# Patient Record
Sex: Female | Born: 1970 | Race: White | Hispanic: No | Marital: Married | Smoking: Never smoker
Health system: Southern US, Community
[De-identification: ages and names within clinical notes are randomized; demographics above are authoritative.]

## PROBLEM LIST (undated history)

## (undated) DIAGNOSIS — Z8742 Personal history of other diseases of the female genital tract: Secondary | ICD-10-CM

## (undated) DIAGNOSIS — Z8719 Personal history of other diseases of the digestive system: Secondary | ICD-10-CM

## (undated) DIAGNOSIS — Z8041 Family history of malignant neoplasm of ovary: Secondary | ICD-10-CM

## (undated) DIAGNOSIS — K219 Gastro-esophageal reflux disease without esophagitis: Secondary | ICD-10-CM

## (undated) DIAGNOSIS — R51 Headache: Secondary | ICD-10-CM

## (undated) DIAGNOSIS — F329 Major depressive disorder, single episode, unspecified: Secondary | ICD-10-CM

## (undated) HISTORY — PX: DILATION AND CURETTAGE OF UTERUS: SHX78

## (undated) HISTORY — DX: Gilbert syndrome: E80.4

## (undated) HISTORY — DX: Major depressive disorder, single episode, unspecified: F32.9

## (undated) HISTORY — DX: Headache: R51

## (undated) HISTORY — PX: WISDOM TOOTH EXTRACTION: SHX21

## (undated) HISTORY — DX: Family history of malignant neoplasm of ovary: Z80.41

## (undated) HISTORY — PX: COLONOSCOPY: SHX174

## (undated) HISTORY — DX: Personal history of other diseases of the female genital tract: Z87.42

---

## 1996-11-28 ENCOUNTER — Encounter: Payer: Self-pay | Admitting: Internal Medicine

## 1996-12-09 ENCOUNTER — Encounter: Payer: Self-pay | Admitting: Internal Medicine

## 1999-01-24 ENCOUNTER — Other Ambulatory Visit: Admission: RE | Admit: 1999-01-24 | Discharge: 1999-01-24 | Payer: Self-pay | Admitting: Obstetrics and Gynecology

## 1999-03-30 ENCOUNTER — Ambulatory Visit (HOSPITAL_COMMUNITY): Admission: RE | Admit: 1999-03-30 | Discharge: 1999-03-30 | Payer: Self-pay | Admitting: Obstetrics and Gynecology

## 1999-03-30 ENCOUNTER — Encounter: Payer: Self-pay | Admitting: Obstetrics and Gynecology

## 1999-08-24 ENCOUNTER — Inpatient Hospital Stay (HOSPITAL_COMMUNITY): Admission: AD | Admit: 1999-08-24 | Discharge: 1999-08-26 | Payer: Self-pay | Admitting: Obstetrics and Gynecology

## 2000-02-14 ENCOUNTER — Other Ambulatory Visit: Admission: RE | Admit: 2000-02-14 | Discharge: 2000-02-14 | Payer: Self-pay | Admitting: Obstetrics and Gynecology

## 2001-03-10 ENCOUNTER — Other Ambulatory Visit: Admission: RE | Admit: 2001-03-10 | Discharge: 2001-03-10 | Payer: Self-pay | Admitting: Obstetrics and Gynecology

## 2002-06-30 ENCOUNTER — Other Ambulatory Visit: Admission: RE | Admit: 2002-06-30 | Discharge: 2002-06-30 | Payer: Self-pay | Admitting: Obstetrics and Gynecology

## 2003-07-28 ENCOUNTER — Other Ambulatory Visit: Admission: RE | Admit: 2003-07-28 | Discharge: 2003-07-28 | Payer: Self-pay | Admitting: Obstetrics and Gynecology

## 2004-02-16 ENCOUNTER — Encounter: Admission: RE | Admit: 2004-02-16 | Discharge: 2004-02-16 | Payer: Self-pay | Admitting: Family Medicine

## 2005-05-24 ENCOUNTER — Other Ambulatory Visit: Admission: RE | Admit: 2005-05-24 | Discharge: 2005-05-24 | Payer: Self-pay | Admitting: Obstetrics and Gynecology

## 2005-08-28 ENCOUNTER — Ambulatory Visit: Payer: Self-pay | Admitting: Internal Medicine

## 2005-08-30 ENCOUNTER — Ambulatory Visit: Payer: Self-pay | Admitting: Internal Medicine

## 2005-09-18 ENCOUNTER — Ambulatory Visit: Payer: Self-pay | Admitting: Internal Medicine

## 2006-06-11 ENCOUNTER — Other Ambulatory Visit: Admission: RE | Admit: 2006-06-11 | Discharge: 2006-06-11 | Payer: Self-pay | Admitting: Obstetrics and Gynecology

## 2006-08-21 ENCOUNTER — Ambulatory Visit: Payer: Self-pay | Admitting: Internal Medicine

## 2006-09-05 ENCOUNTER — Ambulatory Visit: Payer: Self-pay | Admitting: Internal Medicine

## 2006-09-24 ENCOUNTER — Ambulatory Visit: Payer: Self-pay | Admitting: Internal Medicine

## 2007-09-26 ENCOUNTER — Ambulatory Visit: Payer: Self-pay | Admitting: Internal Medicine

## 2007-09-26 DIAGNOSIS — N943 Premenstrual tension syndrome: Secondary | ICD-10-CM | POA: Insufficient documentation

## 2007-09-26 DIAGNOSIS — M2550 Pain in unspecified joint: Secondary | ICD-10-CM | POA: Insufficient documentation

## 2007-09-28 LAB — CONVERTED CEMR LAB
Basophils Absolute: 0 10*3/uL (ref 0.0–0.1)
Basophils Relative: 0.3 % (ref 0.0–1.0)
Eosinophils Absolute: 0.1 10*3/uL (ref 0.0–0.6)
Eosinophils Relative: 2 % (ref 0.0–5.0)
HCT: 39.3 % (ref 36.0–46.0)
Hemoglobin: 13.9 g/dL (ref 12.0–15.0)
Lymphocytes Relative: 28.3 % (ref 12.0–46.0)
MCHC: 35.4 g/dL (ref 30.0–36.0)
MCV: 90.4 fL (ref 78.0–100.0)
Monocytes Absolute: 0.3 10*3/uL (ref 0.2–0.7)
Monocytes Relative: 6.6 % (ref 3.0–11.0)
Neutro Abs: 3.4 10*3/uL (ref 1.4–7.7)
Neutrophils Relative %: 62.8 % (ref 43.0–77.0)
Platelets: 219 10*3/uL (ref 150–400)
RBC: 4.35 M/uL (ref 3.87–5.11)
RDW: 11.3 % — ABNORMAL LOW (ref 11.5–14.6)
Rheumatoid fact SerPl-aCnc: <20 intl units/mL — ABNORMAL LOW (ref 0.0–20.0)
Sed Rate: 4 mm/hr (ref 0–25)
TSH: 1.4 microintl units/mL (ref 0.35–5.50)
WBC: 5.5 10*3/uL (ref 4.5–10.5)

## 2007-09-29 ENCOUNTER — Encounter (INDEPENDENT_AMBULATORY_CARE_PROVIDER_SITE_OTHER): Payer: Self-pay | Admitting: *Deleted

## 2008-01-21 ENCOUNTER — Encounter: Payer: Self-pay | Admitting: Internal Medicine

## 2008-01-21 ENCOUNTER — Telehealth: Payer: Self-pay | Admitting: Internal Medicine

## 2008-01-23 ENCOUNTER — Telehealth (INDEPENDENT_AMBULATORY_CARE_PROVIDER_SITE_OTHER): Payer: Self-pay | Admitting: *Deleted

## 2008-02-20 ENCOUNTER — Ambulatory Visit: Payer: Self-pay | Admitting: Internal Medicine

## 2008-03-01 ENCOUNTER — Telehealth (INDEPENDENT_AMBULATORY_CARE_PROVIDER_SITE_OTHER): Payer: Self-pay | Admitting: *Deleted

## 2008-07-02 ENCOUNTER — Telehealth (INDEPENDENT_AMBULATORY_CARE_PROVIDER_SITE_OTHER): Payer: Self-pay | Admitting: *Deleted

## 2009-01-20 ENCOUNTER — Ambulatory Visit: Payer: Self-pay | Admitting: Internal Medicine

## 2009-01-20 DIAGNOSIS — K219 Gastro-esophageal reflux disease without esophagitis: Secondary | ICD-10-CM

## 2009-01-20 DIAGNOSIS — G43909 Migraine, unspecified, not intractable, without status migrainosus: Secondary | ICD-10-CM | POA: Insufficient documentation

## 2009-01-21 LAB — CONVERTED CEMR LAB: Troponin I: 0.01 ng/mL (ref <0.06)

## 2009-01-24 ENCOUNTER — Encounter (INDEPENDENT_AMBULATORY_CARE_PROVIDER_SITE_OTHER): Payer: Self-pay | Admitting: *Deleted

## 2009-02-01 ENCOUNTER — Ambulatory Visit: Payer: Self-pay | Admitting: Internal Medicine

## 2009-02-01 ENCOUNTER — Encounter (INDEPENDENT_AMBULATORY_CARE_PROVIDER_SITE_OTHER): Payer: Self-pay | Admitting: *Deleted

## 2009-02-01 LAB — CONVERTED CEMR LAB
OCCULT 1: NEGATIVE
OCCULT 2: NEGATIVE
OCCULT 3: NEGATIVE

## 2009-03-29 ENCOUNTER — Ambulatory Visit: Payer: Self-pay | Admitting: Internal Medicine

## 2009-10-12 ENCOUNTER — Ambulatory Visit: Payer: Self-pay | Admitting: Internal Medicine

## 2009-10-28 ENCOUNTER — Telehealth: Payer: Self-pay | Admitting: Internal Medicine

## 2009-11-02 ENCOUNTER — Ambulatory Visit: Payer: Self-pay | Admitting: Internal Medicine

## 2009-12-17 HISTORY — PX: NOVASURE ABLATION: SHX5394

## 2009-12-17 HISTORY — PX: HYSTEROSCOPY: SHX211

## 2009-12-17 HISTORY — PX: COLONOSCOPY: SHX174

## 2010-01-31 ENCOUNTER — Telehealth (INDEPENDENT_AMBULATORY_CARE_PROVIDER_SITE_OTHER): Payer: Self-pay | Admitting: *Deleted

## 2010-02-28 ENCOUNTER — Emergency Department (HOSPITAL_BASED_OUTPATIENT_CLINIC_OR_DEPARTMENT_OTHER): Admission: EM | Admit: 2010-02-28 | Discharge: 2010-02-28 | Payer: Self-pay | Admitting: Emergency Medicine

## 2010-04-21 ENCOUNTER — Ambulatory Visit: Payer: Self-pay | Admitting: Internal Medicine

## 2010-04-21 DIAGNOSIS — K589 Irritable bowel syndrome without diarrhea: Secondary | ICD-10-CM

## 2010-04-24 ENCOUNTER — Encounter (INDEPENDENT_AMBULATORY_CARE_PROVIDER_SITE_OTHER): Payer: Self-pay | Admitting: *Deleted

## 2010-05-04 ENCOUNTER — Ambulatory Visit: Payer: Self-pay | Admitting: Internal Medicine

## 2010-05-08 LAB — CONVERTED CEMR LAB
ALT: 13 units/L (ref 0–35)
AST: 18 units/L (ref 0–37)
Albumin: 4.2 g/dL (ref 3.5–5.2)
Alkaline Phosphatase: 43 units/L (ref 39–117)
BUN: 16 mg/dL (ref 6–23)
Basophils Absolute: 0 10*3/uL (ref 0.0–0.1)
Basophils Relative: 0.8 % (ref 0.0–3.0)
Bilirubin, Direct: 0.2 mg/dL (ref 0.0–0.3)
CO2: 29 meq/L (ref 19–32)
Calcium: 9.3 mg/dL (ref 8.4–10.5)
Chloride: 104 meq/L (ref 96–112)
Cholesterol: 174 mg/dL (ref 0–200)
Creatinine, Ser: 0.7 mg/dL (ref 0.4–1.2)
Eosinophils Absolute: 0.2 10*3/uL (ref 0.0–0.7)
Eosinophils Relative: 4.1 % (ref 0.0–5.0)
GFR calc non Af Amer: 95.29 mL/min (ref >60)
Glucose, Bld: 83 mg/dL (ref 70–99)
HCT: 42 % (ref 36.0–46.0)
HDL: 71.6 mg/dL (ref >39.00)
Hemoglobin: 14.5 g/dL (ref 12.0–15.0)
LDL Cholesterol: 95 mg/dL (ref 0–99)
Lymphocytes Relative: 38.8 % (ref 12.0–46.0)
Lymphs Abs: 1.5 10*3/uL (ref 0.7–4.0)
MCHC: 34.6 g/dL (ref 30.0–36.0)
MCV: 92.4 fL (ref 78.0–100.0)
Monocytes Absolute: 0.3 10*3/uL (ref 0.1–1.0)
Monocytes Relative: 6.8 % (ref 3.0–12.0)
Neutro Abs: 1.9 10*3/uL (ref 1.4–7.7)
Neutrophils Relative %: 49.5 % (ref 43.0–77.0)
Platelets: 223 10*3/uL (ref 150.0–400.0)
Potassium: 5.1 meq/L (ref 3.5–5.1)
RBC: 4.55 M/uL (ref 3.87–5.11)
RDW: 12.6 % (ref 11.5–14.6)
Sodium: 141 meq/L (ref 135–145)
TSH: 1.64 microintl units/mL (ref 0.35–5.50)
Total Bilirubin: 1.1 mg/dL (ref 0.3–1.2)
Total CHOL/HDL Ratio: 2
Total Protein: 6.5 g/dL (ref 6.0–8.3)
Triglycerides: 38 mg/dL (ref 0.0–149.0)
VLDL: 7.6 mg/dL (ref 0.0–40.0)
WBC: 3.9 10*3/uL — ABNORMAL LOW (ref 4.5–10.5)

## 2010-05-19 ENCOUNTER — Telehealth (INDEPENDENT_AMBULATORY_CARE_PROVIDER_SITE_OTHER): Payer: Self-pay | Admitting: *Deleted

## 2010-07-12 ENCOUNTER — Ambulatory Visit (HOSPITAL_COMMUNITY): Admission: RE | Admit: 2010-07-12 | Discharge: 2010-07-12 | Payer: Self-pay | Admitting: Obstetrics and Gynecology

## 2010-07-18 ENCOUNTER — Telehealth: Payer: Self-pay | Admitting: Gastroenterology

## 2010-07-19 ENCOUNTER — Ambulatory Visit: Payer: Self-pay | Admitting: Gastroenterology

## 2010-07-19 DIAGNOSIS — R142 Eructation: Secondary | ICD-10-CM

## 2010-07-19 DIAGNOSIS — R198 Other specified symptoms and signs involving the digestive system and abdomen: Secondary | ICD-10-CM

## 2010-07-19 DIAGNOSIS — R141 Gas pain: Secondary | ICD-10-CM

## 2010-07-19 DIAGNOSIS — R143 Flatulence: Secondary | ICD-10-CM

## 2010-07-19 DIAGNOSIS — K921 Melena: Secondary | ICD-10-CM

## 2010-07-19 LAB — CONVERTED CEMR LAB: IgA: 112 mg/dL (ref 68–378)

## 2010-09-08 ENCOUNTER — Ambulatory Visit: Payer: Self-pay | Admitting: Gastroenterology

## 2011-01-14 LAB — CONVERTED CEMR LAB
Basophils Absolute: 0 10*3/uL (ref 0.0–0.1)
Basophils Relative: 0.1 % (ref 0.0–3.0)
CK-MB: 1.1 ng/mL (ref 0.3–4.0)
Eosinophils Absolute: 0.2 10*3/uL (ref 0.0–0.7)
Eosinophils Relative: 4 % (ref 0.0–5.0)
HCT: 41.4 % (ref 36.0–46.0)
Hemoglobin: 14.1 g/dL (ref 12.0–15.0)
Lymphocytes Relative: 24.8 % (ref 12.0–46.0)
MCHC: 34 g/dL (ref 30.0–36.0)
MCV: 93.3 fL (ref 78.0–100.0)
Monocytes Absolute: 0.3 10*3/uL (ref 0.1–1.0)
Monocytes Relative: 5.8 % (ref 3.0–12.0)
Neutro Abs: 3.7 10*3/uL (ref 1.4–7.7)
Neutrophils Relative %: 65.3 % (ref 43.0–77.0)
Platelets: 246 10*3/uL (ref 150–400)
RBC: 4.44 M/uL (ref 3.87–5.11)
RDW: 11.8 % (ref 11.5–14.6)
Total CK: 41 units/L (ref 7–177)
WBC: 5.6 10*3/uL (ref 4.5–10.5)

## 2011-01-18 NOTE — Letter (Signed)
Summary: [employer] [facility] [employer]   Imported By: Sherian Rein 07/20/2010 07:36:57  _____________________________________________________________________  External Attachment:    Type:   Image     Comment:   External Document

## 2011-01-18 NOTE — Letter (Signed)
Summary: [employer] [facility] [employer]   Imported By: Sherian Rein 07/20/2010 07:36:09  _____________________________________________________________________  External Attachment:    Type:   Image     Comment:   External Document

## 2011-01-18 NOTE — Letter (Signed)
Summary: New Patient letter  [facility]  [address] [place], [place] [address]   Phone: [phone]  Fax: [phone]       04/24/2010 MRN: [id number]  [facility] [address] [place], [place]  [address]  Dear Ms. Staup,  Welcome to the Gastroenterology Division at [facility].    You are scheduled to see Dr.  Claudette Head on June 13, 2010 at 2:30pm on the 3rd floor at [facility], [address]  We ask that you try to arrive at our office 15 minutes prior to your appointment time to allow for check-in.  We would like you to complete the enclosed self-administered evaluation form prior to your visit and bring it with you on the day of your appointment.  We will review it with you.  Also, please bring a complete list of all your medications or, if you prefer, bring the medication bottles and we will list them.  Please bring your insurance card so that we may make a copy of it.  If your insurance requires a referral to see a specialist, please bring your referral form from your primary care physician.  Co-payments are due at the time of your visit and may be paid by cash, check or credit card.     Your office visit will consist of a consult with your physician (includes a physical exam), any laboratory testing he/she may order, scheduling of any necessary diagnostic testing (e.g. x-ray, ultrasound, CT-scan), and scheduling of a procedure (e.g. Endoscopy, Colonoscopy) if required.  Please allow enough time on your schedule to allow for any/all of these possibilities.    If you cannot keep your appointment, please call [phone] to cancel or reschedule prior to your appointment date.  This allows [place] the opportunity to schedule an appointment for another patient in need of care.  If you do not cancel or reschedule by 5 p.m. the business day prior to your appointment date, you will be charged a $50.00 late cancellation/no-show fee.    Thank you for  choosing [facility] for your medical needs.  We appreciate the opportunity to care for you.  Please visit [place] at our website  to learn more about our practice.                     Sincerely,                                                             The Gastroenterology Division

## 2011-01-18 NOTE — Progress Notes (Signed)
Summary: Med Change  Phone Note Call from Patient Call back at Home Phone [phone]   Caller: Patient Reason for Call: Refill Medication Summary of Call: Patient would like to change her Imitrex to Maxalt. Insurance will cover this.   [facility].  Thanks Initial call taken by: Harold Barban,  May 19, 2010 10:29 AM  Follow-up for Phone Call        Dr.Hopper please advise on med change Follow-up by: Shonna Chock,  May 19, 2010 10:38 AM  Additional Follow-up for Phone Call Additional follow up Details #1::        OK , 10 mg # 9 Additional Follow-up by: Marga Melnick MD,  May 19, 2010 2:14 PM    Additional Follow-up for Phone Call Additional follow up Details #2::    Left message on patient's home machine informing her RX sent in./[facility]  May 19, 2010 2:26 PM   New/Updated Medications: MAXALT 10 MG TABS (RIZATRIPTAN BENZOATE) 1 by mouth as needed x 1 for migraines, may repeat after 2 hours; Max 30mg /day Prescriptions: MAXALT 10 MG TABS (RIZATRIPTAN BENZOATE) 1 by mouth as needed x 1 for migraines, may repeat after 2 hours; Max 30mg /day  #9 x 0   Entered by:   Shonna Chock   Authorized by:   Marga Melnick MD   Signed by:   Shonna Chock on 05/19/2010   Method used:   [facility]  [address] [id number]       [address]       [place], [place]  [address]       Ph: [phone] or [phone]       Fax: [phone]   RxID:   [id number]

## 2011-01-18 NOTE — Procedures (Signed)
Summary: Colonoscopy  Patient: [name] Note: All result statuses are Final unless otherwise noted.  Tests: (1) Colonoscopy (COL)   COL Colonoscopy           DONE     [facility]     [address]     [place], [place]  [address]           COLONOSCOPY PROCEDURE REPORT           PATIENT:  Erica Gordon, Erica Gordon  MR#:  [id number]     BIRTHDATE:  07-Mar-1971, 39 yrs. old  GENDER:  female     ENDOSCOPIST:  Judie Petit T. Russella Dar, MD, [facility]           PROCEDURE DATE:  09/08/2010     PROCEDURE:  Colonoscopy [address]     ASA CLASS:  Class II     INDICATIONS:  1) hematochezia  2) change in bowel habits 3)     abdominal pain and bloating     MEDICATIONS:   Fentanyl 100 mcg IV, Versed 12 mg IV     DESCRIPTION OF PROCEDURE:   After the risks benefits and     alternatives of the procedure were thoroughly explained, informed     consent was obtained.  Digital rectal exam was performed and     revealed no abnormalities.   The LB [id number] endoscope     was introduced through the anus and advanced to the terminal ileum     which was intubated for a short distance, limited by a tortuous     colon. The quality of the prep was excellent, using MoviPrep.  The     instrument was then slowly withdrawn as the colon was fully     examined.     <<PROCEDUREIMAGES>>     FINDINGS:  A normal appearing cecum, ileocecal valve, and     appendiceal orifice were identified. The ascending, hepatic     flexure, transverse, splenic flexure, descending, sigmoid colon,     and rectum appeared unremarkable. The terminal ileum appeared     normal. Retroflexed views in the rectum revealed internal     hemorrhoids, small.  The time to cecum =  8  minutes. The scope     was then withdrawn (time =  8.5  min) from the patient and the     procedure completed.           COMPLICATIONS:  None           ENDOSCOPIC IMPRESSION:     1) Normal colon     2) Normal terminal ileum     3) Internal hemorrhoids        RECOMMENDATIONS:     1) Return to the care of your primary provider. GI follow up as     needed     2) Continue current colorectal screening for "routine risk"     patients with a repeat colonoscopy in 10 years.           Venita Lick. Russella Dar, MD, [facility]           CC: Pecola Lawless, MD           n.     Rosalie DoctorVenita Lick. Stark at 09/08/2010 03:06 PM           Meryl Crutch, [id number]  Note: An exclamation mark (!) indicates a result that was not dispersed into the flowsheet. Document Creation Date: 09/08/2010 3:06 PM _______________________________________________________________________  Marland Kitchen  1) Order result status: Final Collection or observation date-time: 09/08/2010 14:54 Requested date-time:  Receipt date-time:  Reported date-time:  Referring Physician:   Ordering Physician: Claudette Head [phone]) Specimen Source:  Source: Launa Grill Order Number: [id number] Lab site:   Appended Document: Colonoscopy    Clinical Lists Changes  Observations: Added new observation of COLONNXTDUE: 08/2020 (09/08/2010 15:17)

## 2011-01-18 NOTE — Assessment & Plan Note (Signed)
Summary: ibs, gerd....em   History of Present Illness Visit Type: consult Primary GI MD: Elie Goody MD [facility] Primary Mette [place]: Marga Melnick, MD  Requesting [place] Blizzard: Marga Melnick, MD Chief Complaint: Lower abd pain, GERD, bloating, chest pain, and hemorrhoids History of Present Illness:   This is a 40 year old female, who saw Dr. Lina Sar previously in 1997 with crampy, abdominal pain and bloating. Abdominal ultrasound was unremarkable. She was felt to have irritable bowel syndrome.  Patient states she has had problems with abdominal bloating and generalized abdominal discomfort that comes and goes for approximately 20 years. She states her abdomen frequently becomes distended and uncomfortable in the late afternoon or evening and the symptoms generally dissipate by the next morning.  For the past 2 months, she has noticed slight change in bowel habits with loose or less well-formed stools. She has intermittently try to reduce gluten in her diet, which has minimally helped her symptoms.  She occasionally notes small amounts of bright red blood with bowel movements, particularly after running. Recent blood work by Dr. Alwyn Ren was unremarkable. She has intermittent reflux symptoms and intermittent chest pains that are under good control with ranitidine twice daily however she does have frequent breakthrough symptoms.   GI Review of Systems    Reports abdominal pain, acid reflux, bloating, chest pain, and  heartburn.     Location of  Abdominal pain: lower abdomen.    Denies belching, dysphagia with liquids, dysphagia with solids, loss of appetite, nausea, vomiting, vomiting blood, weight loss, and  weight gain.      Reports hemorrhoids.     Denies anal fissure, black tarry stools, change in bowel habit, constipation, diarrhea, diverticulosis, fecal incontinence, heme positive stool, irritable bowel syndrome, jaundice, light color stool, liver problems, rectal bleeding, and  rectal  pain.   Current Medications (verified): 1)  Sumatriptan Succinate 100 Mg Tabs (Sumatriptan Succinate) .... As Directed 2)  Aleve 220 Mg Tabs (Naproxen Sodium) .... As Needed 3)  Ibuprofen 200 Mg Tabs (Ibuprofen) .... As Needed 4)  Multivitamins   Tabs (Multiple Vitamin) .... By Mouth Once Daily 5)  Calcium/magnesium .... 2 By Mouth At Bedtime 6)  Vitamin D3 2000 Unit Caps (Cholecalciferol) .Marland Kitchen.. 1 By Mouth Once Daily 7)  Omega-3 1000 Mg Caps (Omega-3 Fatty Acids) .Marland Kitchen.. 1 By Mouth Once Daily 8)  Ranitidine Hcl 150 Mg Tabs (Ranitidine Hcl) .Marland Kitchen.. 1 Two Times A Day Pre Meals 9)  Maxalt 10 Mg Tabs (Rizatriptan Benzoate) .Marland Kitchen.. 1 By Mouth As Needed X 1 For Migraines, May Repeat After 2 Hours; Max 30mg /day  Allergies (verified): 1)  ! Biaxin  Past History:  Past Medical History: Reviewed history from 04/21/2010 and no changes required. ARTHRALGIA (ICD-719.40) PMS (ICD-625.4) FATIGUE (ICD-780.79) Gilbert's Syndrome Headache, ovulation/menses related GERD; ? 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 IBS, 4 vessel CBAG @ 71, hypothyroidism Mother: HTN,NHL,breast cancer Siblings: sister: hyperthyroidism ; PGM :  hypothyroidism,melanoma; PGF: pacer; MGM : endometrial cancer No FH of Colon Cancer:  Social History: Reviewed history from 04/21/2010 and no changes required. Never Smoked Alcohol use-yes: socially Occupation: Charity fundraiser, Pediatrics Married Regular exercise-yes: CVE 3-4x/week as running 2-3 mpd  Review of Systems       The patient complains of headaches-new.         The pertinent positives and negatives are noted as above and in the HPI. All other ROS were reviewed  and were negative.   Vital Signs:  Patient profile:   40 year old female Height:      67 inches Weight:      130 pounds BMI:     20.43 BSA:     1.69 Pulse rate:   68 / minute Pulse rhythm:   regular BP  sitting:   112 / 74  (left arm) Cuff size:   regular  Vitals Entered By: Ok Anis CMA (July 19, 2010 9:54 AM)  Physical Exam  General:  Well developed, well nourished, no acute distress. Head:  Normocephalic and atraumatic. Eyes:  PERRLA, no icterus. Ears:  Normal auditory acuity. Mouth:  No deformity or lesions, dentition normal. Neck:  Supple; no masses or thyromegaly. Lungs:  Clear throughout to auscultation. Heart:  Regular rate and rhythm; no murmurs, rubs,  or bruits. Abdomen:  Soft, nontender and nondistended. No masses, hepatosplenomegaly or hernias noted. Normal bowel sounds. Rectal:  Normal exam. hemocult negative soft brown stool.   Msk:  Symmetrical with no gross deformities. Normal posture. Pulses:  Normal pulses noted. Extremities:  No clubbing, cyanosis, edema or deformities noted. Neurologic:  Alert and  oriented x4;  grossly normal neurologically. Cervical Nodes:  No significant cervical adenopathy. Inguinal Nodes:  No significant inguinal adenopathy. Psych:  Alert and cooperative. Normal mood and affect.  Impression & Recommendations:  Problem # 1:  CHANGE IN BOWELS (ICD-787.99) Change in bowel habits, with looser stools, hematochezia and frequent abdominal bloating and distention. Rule out celiac disease, IBD, colorectal neoplasms. Rule out bacterial overgrowth. Trial of Levsin. Consider a trial of Xifaxan. The risks, benefits and alternatives to colonoscopy with possible biopsy and possible polypectomy were discussed with the patient and they consent to proceed. The procedure will be scheduled electively. Orders: Colonoscopy (Colon)  Problem # 2:  BLOOD IN STOOL (ICD-578.1) Small-volume hematochezia. Colonoscopy as above. Orders: Colonoscopy (Colon) TLB-IgA (Immunoglobulin A) (82784-IGA) T-Sprue Panel (Celiac Disease Aby Eval) (83516x3/[address])  Problem # 3:  ABDOMINAL DISTENTION (ICD-787.3) Plans as outlined above.  Problem # 4:  GERD  (ICD-530.81) Change to omeprazole 20 mg q.a.m. and begin all standard antireflux measures for improved symptom control. Discontinue ranitidine.  Patient Instructions: 1)  Get your labs drawn today in the basement. 2)  Discontinue ranitidine and start omeprazole which has been sent to your pharmacy with two other prescriptions.  3)  Colonoscopy brochure given.  4)  Copy sent to : Marga Melnick, MD 5)  The medication list was reviewed and reconciled.  All changed / newly prescribed medications were explained.  A complete medication list was provided to the patient / caregiver.  Prescriptions: MOVIPREP 100 GM  SOLR (PEG-KCL-NACL-NASULF-NA ASC-C) As per prep instructions.  #1 x 0   Entered by:   [place]ie Nottingham CMA (AAMA)   Authorized by:   Meryl Dare MD [facility]   Signed by:   Meryl Dare MD FACG on 07/19/2010   Method used:   [facility]  [address] [phone]       [address] [place], [place]  [address]       Ph: [phone] or [phone]       Fax: [phone]   RxID:   [id number] LEVSIN 0.125 MG TABS (HYOSCYAMINE SULFATE) 1-2 tablets by mouth every 4 hours as needed for abdominal pain and bloating  #100 x 11  Entered by:   [place]ie Nottingham CMA (AAMA)   Authorized by:   Meryl Dare MD [facility]   Signed by:   Meryl Dare MD FACG on 07/19/2010   Method used:   [facility]  [address] [phone][facility]       [address] [place], [place]  [address]       Ph: [phone] or [phone]       Fax: [phone]   RxID:   [id number] OMEPRAZOLE 20 MG CPDR (OMEPRAZOLE) one tablet by mouth once daily  #30 x 11   Entered by:   Christie Nottingham CMA (AAMA)   Authorized by:   Meryl Dare MD [facility]   Signed by:   Meryl Dare MD FACG on 07/19/2010   Method used:   [facility]        [facility]  [address] [phone][facility]       [address]       [place], [place]  [address]       Ph:  [phone] or [phone]       Fax: [phone]   RxID:   [id number]

## 2011-01-18 NOTE — Progress Notes (Signed)
Summary: med change   Phone Note Outgoing Call   Call placed by: Jeremy Johann CMA,  January 31, 2010 8:52 AM Summary of Call: letter from Jocelyn Lamer stating that pt with benefit plan sponsored by [employer] requires the use of a generic drug before the plan will cover a non-preferred brand drug starting February 14, 2010.per dr hopper change relpax to sumatriptan 100mg  max # allowed with 3 refills.................................Marland KitchenFelecia Deloach CMA  January 31, 2010 8:55 AM   left message to call office to inform pt of med change....................Marland KitchenFelecia Deloach CMA  January 31, 2010 8:56 AM   Follow-up for Phone Call        pt aware rx sent to pharmacy, and med change....................Marland KitchenFelecia Deloach CMA  February 02, 2010 8:29 AM     New/Updated Medications: SUMATRIPTAN SUCCINATE 100 MG TABS (SUMATRIPTAN SUCCINATE) as directed Prescriptions: SUMATRIPTAN SUCCINATE 100 MG TABS (SUMATRIPTAN SUCCINATE) as directed  #9 x 3   Entered by:   Jeremy Johann CMA   Authorized by:   Marga Melnick MD   Signed by:   Jeremy Johann CMA on 02/02/2010   Method used:   [facility] ...       [facility]  [address] [phone]       [address]       [place], [place]  [address]       Ph: [phone] or [phone]       Fax: [phone]   RxID:   [id number]

## 2011-01-18 NOTE — Progress Notes (Signed)
Summary: switch   Phone Note Call from Patient Call back at Home Phone [phone]   Caller: Patient Call For: Russella Dar Reason for Call: Talk to Nurse Summary of Call: Patient is scheduled to see Dr Russella Dar tomorrow but she is a patient of Dr Juanda Chance but she states that she saw Dr Juanda Chance a very long time ago '97 and does not remember her and her husband is a patient of Dr Russella Dar and she really likes him. Wants to switch from Dr Juanda Chance to Dr Russella Dar and wants to keep her appt for tomorrow with him. Initial call taken by: Tawni Levy,  July 18, 2010 8:48 AM  Follow-up for Phone Call        Dr Russella Dar is this ok with you? Darcey Nora RN, [facility]  July 18, 2010 8:54 AM  Additional Follow-up for Phone Call Additional follow up Details #1::        That is fine with me. Additional Follow-up by: Meryl Dare MD [facility],  July 18, 2010 9:20 AM

## 2011-01-18 NOTE — Assessment & Plan Note (Signed)
Summary: cpx//lch   Vital Signs:  Patient profile:   40 year old female Height:      67 inches Weight:      132.4 pounds BMI:     20.81 Temp:     98.8 degrees F oral Pulse rate:   72 / minute Resp:     14 per minute BP sitting:   110 / 70  (left arm) Cuff size:   regular  Vitals Entered By: Shonna Chock (Apr 21, 2010 4:00 PM)  CC: CPX -complete camp form, General Medical Evaluation Comments REVIEWED MED LIST, PATIENT AGREED DOSE AND INSTRUCTION CORRECT    CC:  CPX -complete camp form and General Medical Evaluation.  History of Present Illness: Erica Gordon is here for a physical; she is asymptomatic if she takes  [employer]  generic  Zantac 150mg  two times a day.  Preventive Screening-Counseling & Management  Caffeine-Diet-Exercise     Does Patient Exercise: yes  Allergies: 1)  ! Biaxin  Past History:  Past Medical History: ARTHRALGIA (ICD-719.40) PMS (ICD-625.4) FATIGUE (ICD-780.79) Gilbert's Syndrome Headache, ovulation/menses related GERD; ? IBS Dysfunctional menses due to Uterine Polyps, Dr Stefano Gaul  Past Surgical History: G2 P2; Wisdom Teeth Extraction  Family History: Father:? IBS, 4 vessel CBAG @ 71, hypothyroidism Mother: HTN,NHL,breast cancer Siblings: sister: hyperthyroidism ; PGM :  hypothyroidism,melanoma; PGF: pacer; MGM : endometrial cancer  Social History: Never Smoked Alcohol use-yes: socially Occupation: Charity fundraiser, Pediatrics Married Regular exercise-yes: CVE 3-4x/week as running 2-3 mpd Does Patient Exercise:  yes  Review of Systems General:  Denies chills, fatigue, fever, sleep disorder, sweats, and weight loss. Eyes:  Denies blurring, double vision, and vision loss-both eyes. ENT:  Denies difficulty swallowing and hoarseness. CV:  Denies chest pain or discomfort, leg cramps with exertion, palpitations, shortness of breath with exertion, swelling of feet, and swelling of hands. Resp:  Denies cough, shortness of breath, and sputum  productive. GI:  Complains of gas and indigestion; denies abdominal pain, bloody stools, constipation, dark tarry stools, diarrhea, nausea, and vomiting; Bloating slightly better with decreased gluten. Occasional rectal bleeding after jogging.. GU:  Complains of abnormal vaginal bleeding; denies discharge, dysuria, and hematuria. MS:  Denies joint pain, joint redness, joint swelling, low back pain, mid back pain, and thoracic pain. Derm:  Denies changes in nail beds, dryness, hair loss, and lesion(s). Neuro:  Denies numbness and tingling; Hormonal headaches are controlled  with Relpax; they were uncontrolled on Imitrex. Psych:  Denies anxiety, depression, easily angered, easily tearful, and irritability. Endo:  Denies cold intolerance, excessive hunger, excessive thirst, excessive urination, and heat intolerance. Heme:  Denies abnormal bruising and bleeding. Allergy:  Complains of itching eyes, seasonal allergies, and sneezing.  Physical Exam  General:  Thin but well-nourished; alert,appropriate and cooperative throughout examination Head:  Normocephalic and atraumatic without obvious abnormalities.  Eyes:  No corneal or conjunctival inflammation noted.Perrla. Funduscopic exam benign, without hemorrhages, exudates or papilledema.  Ears:  External ear exam shows no significant lesions or deformities.  Otoscopic examination reveals clear canals, tympanic membranes are intact bilaterally without bulging, retraction, inflammation or discharge. Hearing is grossly normal bilaterally. Nose:  External nasal examination shows no deformity or inflammation. Nasal mucosa are pink and moist without lesions or exudates. Mouth:  Oral mucosa and oropharynx without lesions or exudates.  Teeth in good repair. Neck:  No deformities, masses, or tenderness noted. Lungs:  Normal respiratory effort, chest expands symmetrically. Lungs are clear to auscultation, no crackles or wheezes. Heart:  Normal rate and  regular  rhythm. S1 and S2 normal without gallop, murmur, click, rub.S4 Abdomen:  Bowel sounds positive,abdomen soft and non-tender without masses, organomegaly or hernias noted. Genitalia:  Dr Stefano Gaul Msk:  No deformity or scoliosis noted of thoracic or lumbar spine.   Pulses:  R and L carotid,radial,dorsalis pedis and posterior tibial pulses are full and equal bilaterally Extremities:  No clubbing, cyanosis, edema, or deformity noted with normal full range of motion of all joints.   Neurologic:  alert & oriented X3 and DTRs symmetrical and normal.   Skin:  Intact without suspicious lesions or rashes Cervical Nodes:  No lymphadenopathy noted Axillary Nodes:  No palpable lymphadenopathy Psych:  memory intact for recent and remote, normally interactive, and good eye contact.     Impression & Recommendations:  Problem # 1:  ROUTINE GENERAL MEDICAL EXAM@HEALTH  CARE FACL (ICD-V70.0)  Orders: EKG w/ Interpretation (93000) Gastroenterology Referral (GI)  Problem # 2:  IBS (ICD-564.1)  rare rectal bleeding  Orders: Gastroenterology Referral (GI)  Problem # 3:  GERD (ICD-530.81)  Her updated medication list for this problem includes:    Ranitidine Hcl 150 Mg Tabs (Ranitidine hcl) .Marland Kitchen... 1 two times a day pre meals  Orders: Gastroenterology Referral (GI)  Problem # 4:  HEADACHE (ICD-784.0) Hormonal, responsive to Relpax but not Imitrex Her updated medication list for this problem includes:    Sumatriptan Succinate 100 Mg Tabs (Sumatriptan succinate) .Marland Kitchen... As directed    Relpax 40 Mg Tabs (Eletriptan hydrobromide) .Marland Kitchen... 1 as needed hormonal migraines  Complete Medication List: 1)  Sumatriptan Succinate 100 Mg Tabs (Sumatriptan succinate) .... As directed 2)  Aleve Prn  3)  Ibuprofen Prn  4)  Multivitamin  .... Once daily 5)  Calcium/magnesium  .... 2 by mouth at bedtime 6)  Vitamin D3 2000 Unit Caps (Cholecalciferol) .Marland Kitchen.. 1 by mouth once daily 7)  Omega-3 1000 Mg Caps (Omega-3 fatty  acids) .Marland Kitchen.. 1 by mouth once daily 8)  Ranitidine Hcl 150 Mg Tabs (Ranitidine hcl) .Marland Kitchen.. 1 two times a day pre meals 9)  Relpax 40 Mg Tabs (Eletriptan hydrobromide) .Marland Kitchen.. 1 as needed hormonal migraines  Patient Instructions: 1)  Please schedule fasting labs: 2)  BMP ;Hepatic Panel ;Lipid Panel ;TSH ;CBC w/ Diff . Prescriptions: RELPAX 40 MG TABS (ELETRIPTAN HYDROBROMIDE) 1 as needed Hormonal Migraines  #6 x 2   Entered and Authorized by:   Marga Melnick MD   Signed by:   Marga Melnick MD on 04/21/2010   Method used:   Print then Give to Patient   RxID:   [id number] RANITIDINE HCL 150 MG TABS (RANITIDINE HCL) 1 two times a day pre meals  #180 x 1   Entered and Authorized by:   Marga Melnick MD   Signed by:   Marga Melnick MD on 04/21/2010   Method used:   Print then Give to Patient   RxID:   [id number]    Immunization History:  Tetanus/Td Immunization History:    Tetanus/Td:  tdap (10/22/2006)

## 2011-01-18 NOTE — Letter (Signed)
Summary: [facility] Instructions  [facility]  [address] [place], [place] [address]   Phone: [phone]  Fax: [phone]       Erica Gordon    05/28/1971    MRN: [id number]        Procedure Day /Date: Friday September 23rd, 2011     Arrival Time: 1:30pm     Procedure Time:2:30pm     Location of Procedure:                    _x _  [facility] (4th Floor)                        PREPARATION FOR COLONOSCOPY WITH MOVIPREP   Starting 5 days prior to your procedure9/19/11 do not eat nuts, seeds, popcorn, corn, beans, peas,  salads, or any raw vegetables.  Do not take any fiber supplements (e.g. Metamucil, Citrucel, and Benefiber).  THE DAY BEFORE YOUR PROCEDURE         DATE: 09/07/10  DAY: Thursday  1.  Drink clear liquids the entire day-NO SOLID FOOD  2.  Do not drink anything colored red or purple.  Avoid juices with pulp.  No orange juice.  3.  Drink at least 64 oz. (8 glasses) of fluid/clear liquids during the day to prevent dehydration and help the prep work efficiently.  CLEAR LIQUIDS INCLUDE: Water Jello Ice Popsicles Tea (sugar ok, no milk/cream) Powdered fruit flavored drinks Coffee (sugar ok, no milk/cream) Gatorade Juice: apple, white grape, white cranberry  Lemonade Clear bullion, consomm, broth Carbonated beverages (any kind) Strained chicken noodle soup Hard Candy                             4.  In the morning, mix first dose of MoviPrep solution:    Empty 1 Pouch A and 1 Pouch B into the disposable container    Add lukewarm drinking water to the top line of the container. Mix to dissolve    Refrigerate (mixed solution should be used within 24 hrs)  5.  Begin drinking the prep at 5:00 p.m. The MoviPrep container is divided by 4 marks.   Every 15 minutes drink the solution down to the next mark (approximately 8 oz) until the full liter is complete.   6.  Follow completed prep with 16 oz of clear liquid of your choice  (Nothing red or purple).  Continue to drink clear liquids until bedtime.  7.  Before going to bed, mix second dose of MoviPrep solution:    Empty 1 Pouch A and 1 Pouch B into the disposable container    Add lukewarm drinking water to the top line of the container. Mix to dissolve    Refrigerate  THE DAY OF YOUR PROCEDURE      DATE: 09/08/10 DAY: Friday  Beginning at 9:30 a.m. (5 hours before procedure):         1. Every 15 minutes, drink the solution down to the next mark (approx 8 oz) until the full liter is complete.  2. Follow completed prep with 16 oz. of clear liquid of your choice.    3. You may drink clear liquids until 12:30pm (2 HOURS BEFORE PROCEDURE).   MEDICATION INSTRUCTIONS  Unless otherwise instructed, you should take regular prescription medications with a small sip of water   as early as possible the morning of your procedure.  OTHER INSTRUCTIONS  You will need a responsible adult at least 41 years of age to accompany you and drive you home.   This person must remain in the waiting room during your procedure.  Wear loose fitting clothing that is easily removed.  Leave jewelry and other valuables at home.  However, you may wish to bring a book to read or  an iPod/MP3 player to listen to music as you wait for your procedure to start.  Remove all body piercing jewelry and leave at home.  Total time from sign-in until discharge is approximately 2-3 hours.  You should go home directly after your procedure and rest.  You can resume normal activities the  day after your procedure.  The day of your procedure you should not:   Drive   Make legal decisions   Operate machinery   Drink alcohol   Return to work  You will receive specific instructions about eating, activities and medications before you leave.    The above instructions have been reviewed and explained to me by   Marchelle Folks.     I fully understand and can verbalize these instructions  _____________________________ Date _________

## 2011-03-03 LAB — PREGNANCY, URINE: Preg Test, Ur: NEGATIVE

## 2011-07-22 ENCOUNTER — Other Ambulatory Visit: Payer: Self-pay | Admitting: Gastroenterology

## 2011-07-23 NOTE — Telephone Encounter (Signed)
PT NEEDS OFFICE VISIT FOR FURTHER REFILLS. 

## 2011-08-27 ENCOUNTER — Ambulatory Visit (INDEPENDENT_AMBULATORY_CARE_PROVIDER_SITE_OTHER): Payer: Managed Care, Other (non HMO) | Admitting: Internal Medicine

## 2011-08-27 ENCOUNTER — Encounter: Payer: Self-pay | Admitting: Internal Medicine

## 2011-08-27 VITALS — BP 104/72 | HR 90 | Temp 98.8°F | Wt 133.4 lb

## 2011-08-27 DIAGNOSIS — R05 Cough: Secondary | ICD-10-CM

## 2011-08-27 MED ORDER — ALBUTEROL SULFATE HFA 108 (90 BASE) MCG/ACT IN AERS: 2.0000 | INHALATION_SPRAY | Freq: Four times a day (QID) | RESPIRATORY_TRACT | Status: DC | PRN

## 2011-08-27 MED ORDER — BECLOMETHASONE DIPROPIONATE 80 MCG/ACT IN AERS
2.0000 | INHALATION_SPRAY | Freq: Two times a day (BID) | RESPIRATORY_TRACT | Status: DC
Start: 2011-08-27 — End: 2012-05-20

## 2011-08-27 NOTE — Progress Notes (Signed)
  Subjective:    Patient ID: Erica Gordon, female    DOB: 10-06-1971, 40 y.o.   MRN: [id number]  HPI Cough Onset: early last week Extrinsic symptoms: no itchy eyes, sneezing:  Infectious symptoms: no fever, purulent secretions : Chest symptoms:no pleuritic  pain, sputum production, hemoptysis,dyspnea,wheezing: GI symptoms: no dyspepsia, reflux Occupational/environmental exposures:she painted room prior to onset ,1&1/2 weeks ago Smoking:never ACE inhibitor:no Treatment/efficacy:Nebulizer treatment with albuterol with benefit Past medical history/family history pulmonary disease: PMH of RAD symptoms in Fall more often than in Spring; ragweed allergy    Review of Systems   She denies symptoms of rhinosinusitis such as pain, purulent secretions, or fever.    Objective:   Physical Exam General appearance is of good health and nourishment; no acute distress or increased work of breathing is present.  No  lymphadenopathy about the head, neck, or axilla noted.   Eyes: No conjunctival inflammation or lid edema is present.  Ears:  External ear exam shows no significant lesions or deformities.  Otoscopic examination reveals clear canals, tympanic membranes are intact bilaterally without bulging, retraction, inflammation or discharge.  Nose:  External nasal examination shows no deformity or inflammation. Nasal mucosa are pink and moist without lesions or exudates. No septal dislocation or dislocation.No obstruction to airflow.   Oral exam: Dental hygiene is good; lips and gums are healthy appearing.There is no oropharyngeal erythema or exudate noted.    Heart:  Normal rate and regular rhythm. S1 and S2 normal without gallop, murmur, click, rub.S4  Lungs:Chest clear to auscultation; no wheezes, rhonchi,rales ,or rubs present.No increased work of breathing.  Recurrent dry cough  Extremities:  No cyanosis, edema, or clubbing  noted    Skin: Warm & dry           Assessment & Plan:  #1  cough, nonproductive. This is probably a reactive airways disease variant.  Plan: See orders and recommendations.

## 2011-08-27 NOTE — Patient Instructions (Signed)
Please use the samples as we discussed. The albuterol should be used every 4-6 hours for coughing or wheezing during the day. The Qvar will treat inflammatory component as discussed.

## 2011-09-07 ENCOUNTER — Ambulatory Visit (INDEPENDENT_AMBULATORY_CARE_PROVIDER_SITE_OTHER): Payer: Managed Care, Other (non HMO) | Admitting: Internal Medicine

## 2011-09-07 DIAGNOSIS — M533 Sacrococcygeal disorders, not elsewhere classified: Secondary | ICD-10-CM

## 2011-09-07 DIAGNOSIS — R059 Cough, unspecified: Secondary | ICD-10-CM

## 2011-09-07 DIAGNOSIS — K219 Gastro-esophageal reflux disease without esophagitis: Secondary | ICD-10-CM

## 2011-09-07 DIAGNOSIS — R05 Cough: Secondary | ICD-10-CM

## 2011-09-07 MED ORDER — OMEPRAZOLE 20 MG PO CPDR: 20.0000 mg | DELAYED_RELEASE_CAPSULE | ORAL | Status: DC

## 2011-09-07 MED ORDER — TRAMADOL HCL 50 MG PO TABS: 50.0000 mg | ORAL_TABLET | Freq: Four times a day (QID) | ORAL | Status: DC | PRN

## 2011-09-07 MED ORDER — HYDROCODONE-HOMATROPINE 5-1.5 MG/5ML PO SYRP: 5.0000 mL | ORAL_SOLUTION | Freq: Four times a day (QID) | ORAL | Status: AC | PRN

## 2011-09-07 NOTE — Patient Instructions (Signed)
The triggers for reflux, dyspepsia or "heart burn"  include stress; the "aspirin family" ; alcohol; peppermint; and caffeine (coffee, tea, cola, and chocolate). The aspirin family would include aspirin and the nonsteroidal agents such as ibuprofen &  Naproxen. Tylenol would not cause reflux. If having dyspepsia ; food & drink should be avoided for @ least 2 hours before going to bed.

## 2011-09-07 NOTE — Progress Notes (Signed)
  Subjective:    Patient ID: Erica Gordon, female    DOB: Jun 05, 1971, 40 y.o.   MRN: [id number]  HPI Cough Triggers: talking & after lying down Extrinsic symptoms:itchy eyes, sneezing:  Infectious symptoms :fever, purulent secretions :no Chest symptoms: sputum production,dyspnea,wheezing:paroxysmal cough GI symptoms: Dyspepsia, reflux: "burning " w/o OTC  PPI; some throat burning, ?from inhalers. She takes NSAIDS @ least weekly. Occasional alcohol.Coffee 2/ day; no soda; occasional tea. Occupational/environmental exposures: Smoking:never ACE inhibitor:no Treatment/efficacy: no improvement with inhalers Past medical history/family history pulmonary disease: no    Review of Systems     Objective:   Physical Exam  She is thin but appears healthy and well-nourished  The otolaryngologic exam is unremarkable except for minimal erythema and edema of the inferior uvula  Chest is clear without rhonchi, rales, or wheezes. She exhibits a low-grade hacking cough he  She is an S4 no significant murmurs or gallops.  She has no lymphadenopathy the neck or axilla        Assessment & Plan:  #1 cough; has been no response to inhaled steroids or albuterol  suggesting this is not a reactive airways phenomen. She has reflux symptoms in there are triggers for reflux as noted above. The symptoms of historically flared on the over-the-counter omeprazole.  Plan: She'll be placed on the omeprazole 30 minutes before breakfast and before the evening meal. Restriction of possible triggers will be emphasized.

## 2011-10-23 ENCOUNTER — Other Ambulatory Visit: Payer: Self-pay | Admitting: Internal Medicine

## 2012-04-10 ENCOUNTER — Other Ambulatory Visit: Payer: Self-pay | Admitting: Internal Medicine

## 2012-04-28 ENCOUNTER — Encounter: Payer: Managed Care, Other (non HMO) | Admitting: Internal Medicine

## 2012-05-06 ENCOUNTER — Encounter: Payer: Self-pay | Admitting: Obstetrics and Gynecology

## 2012-05-06 ENCOUNTER — Ambulatory Visit (INDEPENDENT_AMBULATORY_CARE_PROVIDER_SITE_OTHER): Payer: Managed Care, Other (non HMO) | Admitting: Obstetrics and Gynecology

## 2012-05-06 VITALS — BP 120/70 | Resp 16 | Ht 67.0 in | Wt 131.0 lb

## 2012-05-06 DIAGNOSIS — Z01419 Encounter for gynecological examination (general) (routine) without abnormal findings: Secondary | ICD-10-CM

## 2012-05-06 NOTE — Progress Notes (Signed)
Contraception vas Last pap 2010 Last Mammo 2012 Last Colonoscopy 2011 Last Dexa Scan never Primary MD Dr. Alwyn Ren Abuse at Home none  Subjective:    Erica Gordon is a 41 y.o. female, G2P2, who presents for an annual exam. See above. The patient had an endometrial ablation.  She has spotting for her periods only.  She complains of right lower quadrant pain associated with orgasm.  She has had some stomach trouble as well as spine trouble.  She has some urinary incontinence when she runs.  Prior Hysterectomy: No    History   Social History  . Marital Status: Married    Spouse Name: N/A    Number of Children: N/A  . Years of Education: N/A   Social History Main Topics  . Smoking status: Never Smoker   . Smokeless tobacco: None  . Alcohol Use: Yes  . Drug Use: No  . Sexually Active: None   Other Topics Concern  . None   Social History Narrative  . None    Menstrual cycle:   LMP: Patient's last menstrual period was 04/13/2012.           Cycle: spots only at the time of her period.  The following portions of the patient's history were reviewed and updated as appropriate: allergies, current medications, past family history, past medical history, past social history, past surgical history and problem list.  Review of Systems Pertinent items are noted in HPI. Breast:Negative for breast lump,nipple discharge or nipple retraction Gastrointestinal: Negative for abdominal pain, change in bowel habits or rectal bleeding Urinary:negative   Objective:    BP 120/70  Resp 16  Ht 5\' 7"  (1.702 m)  Wt 131 lb (59.421 kg)  BMI 20.52 kg/m2  LMP 04/13/2012    Weight:  Wt Readings from Last 1 Encounters:  05/06/12 131 lb (59.421 kg)          BMI: Body mass index is 20.52 kg/(m^2).  General Appearance: Alert, appropriate appearance for age. No acute distress HEENT: Grossly normal Neck / Thyroid: Supple, no masses, nodes or enlargement Lungs: clear to auscultation  bilaterally Back: No CVA tenderness Breast Exam: No masses or nodes.No dimpling, nipple retraction or discharge. Cardiovascular: Regular rate and rhythm. S1, S2, no murmur Gastrointestinal: Soft, non-tender, no masses or organomegaly  ++++++++++++++++++++++++++++++++++++++++++++++++++++++++  Pelvic Exam: External genitalia: normal general appearance Vaginal: normal without tenderness, induration or masses and relaxation noted Cervix: normal appearance Adnexa: normal bimanual exam Uterus: normal size shape and consistency Rectovaginal: normal rectal, no masses  ++++++++++++++++++++++++++++++++++++++++++++++++++++++++  Lymphatic Exam: Non-palpable nodes in neck, clavicular, axillary, or inguinal regions Neurologic: Normal speech, no tremor  Psychiatric: Alert and oriented, appropriate affect.   Wet Prep:not applicable Urinalysis:not applicable UPT: Not done   Assessment:    Normal gyn exam   Right lower quadrant pain with orgasm of uncertain etiology  Overweight or obese: No   Pelvic relaxation: Yes   Plan:    mammogram return annually or prn Contraception:vasectomy  The patient will observe her pain.  She will call if it does not improve.  Ultrasound and CT offered.   STD screen request: none  RPR: No.   HBsAg: No.  Hepatitis C: No.  The updated Pap smear screening guidelines were discussed with the patient. The patient requested that I obtain a Pap smear: No.  Kegel exercises discussed: Yes.  Proper diet and regular exercise were reviewed.  Annual mammograms recommended starting at age 9. Proper breast care was discussed.  Screening colonoscopy is  recommended beginning at age 78.  Regular health maintenance was reviewed.  Sleep hygiene was discussed.  Adequate calcium and vitamin D intake was emphasized.  Mylinda Latina.D.

## 2012-05-06 NOTE — Progress Notes (Signed)
uyfyuf painful morgasm

## 2012-05-20 ENCOUNTER — Ambulatory Visit (INDEPENDENT_AMBULATORY_CARE_PROVIDER_SITE_OTHER): Payer: Managed Care, Other (non HMO) | Admitting: Internal Medicine

## 2012-05-20 ENCOUNTER — Encounter: Payer: Self-pay | Admitting: Internal Medicine

## 2012-05-20 VITALS — BP 112/78 | HR 67 | Temp 98.7°F | Resp 12 | Ht 67.0 in | Wt 134.0 lb

## 2012-05-20 DIAGNOSIS — Z0000 Encounter for general adult medical examination without abnormal findings: Secondary | ICD-10-CM

## 2012-05-20 DIAGNOSIS — M5430 Sciatica, unspecified side: Secondary | ICD-10-CM

## 2012-05-20 DIAGNOSIS — M5431 Sciatica, right side: Secondary | ICD-10-CM

## 2012-05-20 MED ORDER — PANTOPRAZOLE SODIUM 40 MG PO TBEC: 40.0000 mg | DELAYED_RELEASE_TABLET | Freq: Every day | ORAL | Status: DC

## 2012-05-20 MED ORDER — RIZATRIPTAN BENZOATE 10 MG PO TABS: 10.0000 mg | ORAL_TABLET | ORAL | Status: DC | PRN

## 2012-05-20 NOTE — Progress Notes (Signed)
Subjective:    Patient ID: Erica Gordon, female    DOB: 08/23/1971, 41 y.o.   MRN: [id number]  HPI  Erica Gordon is here for a physical;acute issues include low back pain  & GERD      Review of Systems She has daily pain in the right SI joint with intermittent sharp pain down the right posterior thigh. She has not had numbness, tingling, or weakness in this leg. She also has not had urinary or stool incontinence. The symptoms persist despite having been to physical therapy, orthopedic evaluation, and tramadol. She is considering a steroid injection. She occasionally takes nonsteroidals.  Her GERD is suboptimally controlled by omeprazole 20 milligrams daily. She is not had weight loss. She has rectal bleeding after jogging intermittently. 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 Alert, appropriate and cooperative throughout exam. Head: Normocephalic without obvious abnormalities Eyes: No corneal or conjunctival inflammation noted. Pupils equal round reactive to light and accommodation. Fundal exam is benign without hemorrhages, exudate, papilledema. Extraocular motion intact. Vision grossly normal. Ears: External  ear exam reveals no significant lesions or deformities. Canals clear .TMs normal. Hearing is grossly normal bilaterally. Nose: External nasal exam reveals no deformity or inflammation. Nasal mucosa are pink and moist. No lesions or exudates noted.   Mouth: Oral mucosa and oropharynx reveal no lesions or exudates. Teeth in good repair. Neck: No deformities, masses, or tenderness noted. Range of motion & Thyroid normal . Lungs: Normal respiratory effort; chest expands symmetrically. Lungs are clear to auscultation without rales, wheezes, or increased work of breathing. Heart: Normal rate and rhythm. Normal S1 and S2. No gallop, click, or rub. S4 w/o  murmur. Abdomen:  Bowel sounds normal; abdomen soft and nontender. No masses, organomegaly or hernias noted. Genitalia: Dr Erica Gordon  .                                                                                   Musculoskeletal/extremities: No deformity or scoliosis noted of  the thoracic or lumbar spine. There may be minimal asymmetry in the lower lumbar-sacral spine with the right paraspinous tissues greater than left. She is able to lie flat and sit up without help. Straight leg raising his discomfort into the right posterior thigh. No clubbing, cyanosis, edema, or deformity noted. Range of motion  normal .Tone & strength  normal.Joints normal. Nail health  good. Vascular: Carotid, radial artery, dorsalis pedis and  posterior tibial pulses are full and equal. No bruits present. Neurologic: Alert and oriented x3. Deep tendon reflexes symmetrical and normal.          Skin: Intact without suspicious lesions or rashes. Lymph: No cervical, axillary lymphadenopathy present. Psych: Mood and affect are normal. Normally interactive  Assessment & Plan:  #1 comprehensive physical exam; no acute findings #2 see Problem List with Assessments & Recommendations   #3 GERD with suboptimal response to omeprazole. A trial of  Protonix will be initiated; her brother has had significant response to this.  #4 chronic low back pain with some sciatic component. Referral to nonoperative back specialist will be pursued Plan: see Orders

## 2012-05-20 NOTE — Patient Instructions (Signed)
The triggers for reflux  include stress; the "aspirin family" ; alcohol; peppermint; and caffeine (coffee, tea, cola, and chocolate). The aspirin family would include aspirin and the nonsteroidal agents such as ibuprofen &  Naproxen. Tylenol would not cause reflux. If having symptoms ; food & drink should be avoided for @ least 2 hours before going to bed.  Please  schedule fasting Labs : BMET,Lipids, hepatic panel, CBC & dif, TSH. PLEASE BRING THESE INSTRUCTIONS TO FOLLOW UP  LAB APPOINTMENT.This will guarantee correct labs are drawn, eliminating need for repeat blood sampling ( needle sticks ! ). Diagnoses /Codes: V70.0 Please try to go on My Chart within   24 hours after labs drawn to allow me to release the results directly to you

## 2012-05-22 ENCOUNTER — Other Ambulatory Visit: Payer: Self-pay | Admitting: Internal Medicine

## 2012-05-22 DIAGNOSIS — Z0000 Encounter for general adult medical examination without abnormal findings: Secondary | ICD-10-CM

## 2012-05-23 ENCOUNTER — Other Ambulatory Visit (INDEPENDENT_AMBULATORY_CARE_PROVIDER_SITE_OTHER): Payer: Managed Care, Other (non HMO)

## 2012-05-23 DIAGNOSIS — Z0000 Encounter for general adult medical examination without abnormal findings: Secondary | ICD-10-CM

## 2012-05-23 LAB — CBC WITH DIFFERENTIAL/PLATELET
Basophils Absolute: 0 10*3/uL (ref 0.0–0.1)
Basophils Relative: 0.5 % (ref 0.0–3.0)
Eosinophils Absolute: 0.2 10*3/uL (ref 0.0–0.7)
Hemoglobin: 14.4 g/dL (ref 12.0–15.0)
Lymphocytes Relative: 32.2 % (ref 12.0–46.0)
Lymphs Abs: 1.6 10*3/uL (ref 0.7–4.0)
MCHC: 33 g/dL (ref 30.0–36.0)
MCV: 94.3 fl (ref 78.0–100.0)
Monocytes Absolute: 0.3 10*3/uL (ref 0.1–1.0)
Neutro Abs: 2.9 10*3/uL (ref 1.4–7.7)
RBC: 4.64 Mil/uL (ref 3.87–5.11)
RDW: 12.6 % (ref 11.5–14.6)

## 2012-05-23 LAB — LIPID PANEL
Cholesterol: 164 mg/dL (ref 0–200)
LDL Cholesterol: 88 mg/dL (ref 0–99)

## 2012-05-23 LAB — BASIC METABOLIC PANEL
BUN: 14 mg/dL (ref 6–23)
Chloride: 105 mEq/L (ref 96–112)
Creatinine, Ser: 0.7 mg/dL (ref 0.4–1.2)
GFR: 92.84 mL/min (ref >60.00)
Glucose, Bld: 85 mg/dL (ref 70–99)

## 2012-05-23 LAB — HEPATIC FUNCTION PANEL
Alkaline Phosphatase: 44 U/L (ref 39–117)
Bilirubin, Direct: 0.1 mg/dL (ref 0.0–0.3)
Total Bilirubin: 0.7 mg/dL (ref 0.3–1.2)
Total Protein: 6.6 g/dL (ref 6.0–8.3)

## 2012-05-26 ENCOUNTER — Encounter: Payer: Self-pay | Admitting: *Deleted

## 2012-06-23 ENCOUNTER — Encounter: Payer: Self-pay | Admitting: Obstetrics and Gynecology

## 2012-10-19 ENCOUNTER — Other Ambulatory Visit: Payer: Self-pay | Admitting: Internal Medicine

## 2012-11-21 ENCOUNTER — Ambulatory Visit (INDEPENDENT_AMBULATORY_CARE_PROVIDER_SITE_OTHER): Payer: Managed Care, Other (non HMO) | Admitting: Internal Medicine

## 2012-11-21 ENCOUNTER — Encounter: Payer: Self-pay | Admitting: Internal Medicine

## 2012-11-21 VITALS — BP 100/68 | HR 69 | Temp 98.5°F | Wt 135.4 lb

## 2012-11-21 DIAGNOSIS — R05 Cough: Secondary | ICD-10-CM

## 2012-11-21 DIAGNOSIS — J209 Acute bronchitis, unspecified: Secondary | ICD-10-CM

## 2012-11-21 MED ORDER — BENZONATATE 200 MG PO CAPS: 200.0000 mg | ORAL_CAPSULE | Freq: Three times a day (TID) | ORAL | Status: DC | PRN

## 2012-11-21 MED ORDER — AZITHROMYCIN 250 MG PO TABS: ORAL_TABLET | ORAL | Status: DC

## 2012-11-21 MED ORDER — MONTELUKAST SODIUM 10 MG PO TABS: 10.0000 mg | ORAL_TABLET | Freq: Every day | ORAL | Status: DC

## 2012-11-21 MED ORDER — HYDROCOD POLST-CHLORPHEN POLST 10-8 MG/5ML PO LQCR: 5.0000 mL | Freq: Every evening | ORAL | Status: DC | PRN

## 2012-11-21 NOTE — Progress Notes (Signed)
  Subjective:    Patient ID: Erica Gordon, female    DOB: 1971-10-25, 41 y.o.   MRN: [id number]  HPI  Symptoms began approximately 2 weeks ago as an upper respiratory tract infection with throat congestion. This was treated with nasal saline gavage, Sudafed, Benadryl with some improvement. The respiratory tract symptoms improved but she developed a dry cough with some shortness of breath with paroxysms of cough.  She has no history of asthma and has never smoked. She is not on ACE inhibitor. She had a similar problem last year which responded to increasing her reflux medicines. She is not feels that she has significant reflux symptoms at this time. She also denies significant postnasal drainage    Review of Systems She denies extrinsic symptoms of itchy, watery eyes or sneezing. She's had no fever, chills, or sweats. She denies frontal headache, facial pain, or nasal purulence. She's had no wheezing with the cough.     Objective:   Physical Exam General appearance:thin but ingood health ;well nourished; no acute distress or increased work of breathing is present.  No  lymphadenopathy about the head, neck, or axilla noted.   Eyes: No conjunctival inflammation or lid edema is present. .  Ears:  External ear exam shows no significant lesions or deformities.  Otoscopic examination reveals clear canals, tympanic membranes are intact bilaterally without bulging, retraction, inflammation or discharge.  Nose:  External nasal examination shows no deformity or inflammation. Nasal mucosa are pink and moist without lesions or exudates. Slight R septal  deviation.No obstruction to airflow.   Oral exam: Dental hygiene is good; lips and gums are healthy appearing.There is no oropharyngeal erythema or exudate noted.   Neck:  No deformities,  masses, or tenderness noted.     Heart:  Normal rate and regular rhythm. S1 and S2 normal without gallop, murmur, click, rub or other extra sounds.   Lungs:Chest  clear to auscultation; no wheezes, rhonchi,rales ,or rubs present.No increased work of breathing.   Dry hacking cough  Extremities:  No cyanosis, edema, or clubbing  noted    Skin: Warm & dry           Assessment & Plan:  #1 acute bronchitis w/o bronchospasm Plan: See orders and recommendations

## 2012-11-21 NOTE — Patient Instructions (Addendum)
Fill the prescription for generic Singulair if the cough persists. History does suggest an allergic component .

## 2012-11-24 ENCOUNTER — Ambulatory Visit: Payer: Managed Care, Other (non HMO) | Admitting: Internal Medicine

## 2013-01-31 ENCOUNTER — Other Ambulatory Visit: Payer: Self-pay

## 2013-02-11 ENCOUNTER — Other Ambulatory Visit: Payer: Self-pay | Admitting: Internal Medicine

## 2013-07-23 ENCOUNTER — Other Ambulatory Visit: Payer: Self-pay | Admitting: Internal Medicine

## 2013-09-23 LAB — HM PAP SMEAR

## 2013-10-22 ENCOUNTER — Other Ambulatory Visit: Payer: Self-pay

## 2013-12-03 ENCOUNTER — Other Ambulatory Visit: Payer: Self-pay | Admitting: Internal Medicine

## 2013-12-04 NOTE — Telephone Encounter (Signed)
Pantoprazole refilled per protocol. JG//CMA 

## 2014-01-03 ENCOUNTER — Other Ambulatory Visit: Payer: Self-pay | Admitting: Internal Medicine

## 2014-01-04 NOTE — Telephone Encounter (Signed)
Pantoprazole refilled per protocol. JG//CMA

## 2014-01-27 ENCOUNTER — Other Ambulatory Visit: Payer: Self-pay | Admitting: Internal Medicine

## 2014-01-27 NOTE — Telephone Encounter (Signed)
Requesting Maxalt 10mg  Take 1 tablet by mouth as needed for migraine.  May repeat in 2 hours if needed Last refill:02-11-13:#9,1 Last OV:11-21-12 Please advise.//AB/CMA

## 2014-01-27 NOTE — Telephone Encounter (Signed)
OK X 1 but refills require OV as last seen 12/13

## 2014-01-29 NOTE — Telephone Encounter (Signed)
Rx sent to the pharmacy by e-script.  Pt needs office visit before next refill.//AB/CMA

## 2014-02-08 ENCOUNTER — Other Ambulatory Visit: Payer: Self-pay | Admitting: *Deleted

## 2014-02-08 MED ORDER — PANTOPRAZOLE SODIUM 40 MG PO TBEC: DELAYED_RELEASE_TABLET | ORAL | Status: DC

## 2014-02-08 NOTE — Telephone Encounter (Signed)
Rx sent to the pharmacy by e-script.//AB/CMA 

## 2014-02-21 ENCOUNTER — Other Ambulatory Visit: Payer: Self-pay | Admitting: Internal Medicine

## 2014-02-25 ENCOUNTER — Ambulatory Visit (INDEPENDENT_AMBULATORY_CARE_PROVIDER_SITE_OTHER): Payer: 59 | Admitting: Internal Medicine

## 2014-02-25 ENCOUNTER — Encounter: Payer: Self-pay | Admitting: Internal Medicine

## 2014-02-25 VITALS — BP 110/60 | HR 90 | Temp 98.8°F | Ht 67.0 in | Wt 138.0 lb

## 2014-02-25 DIAGNOSIS — M533 Sacrococcygeal disorders, not elsewhere classified: Secondary | ICD-10-CM | POA: Insufficient documentation

## 2014-02-25 DIAGNOSIS — K219 Gastro-esophageal reflux disease without esophagitis: Secondary | ICD-10-CM

## 2014-02-25 DIAGNOSIS — Z0000 Encounter for general adult medical examination without abnormal findings: Secondary | ICD-10-CM

## 2014-02-25 DIAGNOSIS — G43909 Migraine, unspecified, not intractable, without status migrainosus: Secondary | ICD-10-CM

## 2014-02-25 MED ORDER — RIZATRIPTAN BENZOATE 10 MG PO TABS: ORAL_TABLET | ORAL | Status: DC

## 2014-02-25 MED ORDER — PANTOPRAZOLE SODIUM 40 MG PO TBEC: DELAYED_RELEASE_TABLET | ORAL | Status: DC

## 2014-02-25 NOTE — Progress Notes (Signed)
Pre visit review using our clinic review tool, if applicable. No additional management support is needed unless otherwise documented below in the visit note. 

## 2014-02-25 NOTE — Patient Instructions (Signed)
Reflux of gastric acid may be asymptomatic as this may occur mainly during sleep.The triggers for reflux  include stress; the "aspirin family" ; alcohol; peppermint; and caffeine (coffee, tea, cola, and chocolate). The aspirin family would include aspirin and the nonsteroidal agents such as ibuprofen &  Naproxen. Tylenol would not cause reflux. If having symptoms ; food & drink should be avoided for @ least 2 hours before going to bed.  Please take the probiotic , Align OR Florastor, every day until the bowels are normal. This will replace the normal bacteria which  are necessary for formation of normal stool and processing of food.

## 2014-02-25 NOTE — Progress Notes (Signed)
   Subjective:    Patient ID: Erica Gordon, female    DOB: 10/26/1971, 43 y.o.   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 Abnormal bruising or bleeding not present     Objective:   Physical Exam  Gen.: Thin but healthy and well-nourished in appearance. Alert, appropriate and cooperative throughout exam. Appears younger than stated age  Head: Normocephalic without obvious abnormalities Eyes: No corneal or conjunctival inflammation noted. Pupils equal round reactive to light and accommodation. Extraocular motion intact.  Ears: External  ear exam reveals no significant lesions or deformities. Canals clear .TMs normal. Hearing is grossly normal bilaterally. Nose: External nasal exam reveals no deformity or inflammation. Nasal mucosa are pink and moist. No lesions or exudates noted.   Mouth: Oral mucosa and oropharynx reveal no lesions or exudates. Teeth in good repair. Neck: No deformities, masses, or tenderness noted. Range of motion &. Thyroid normal Lungs: Normal respiratory effort; chest expands symmetrically. Lungs are clear to auscultation without rales, wheezes, or increased work of breathing. Heart: Normal rate and rhythm. Normal S1 and S2. No gallop, click, or rub. No murmur. Abdomen: Bowel sounds normal; abdomen soft and nontender. No masses, organomegaly or hernias noted. Genitalia:  as per Gyn                                  Musculoskeletal/extremities: No deformity or scoliosis noted of  the thoracic or lumbar spine.  No clubbing, cyanosis, edema, or significant extremity  deformity noted. Range of motion normal .Tone & strength normal. Hand joints normal   Fingernail  health good. Able to lie down & sit up w/o help. Negative SLR bilaterally Vascular: Carotid,  radial artery, dorsalis pedis and  posterior tibial pulses are full and equal. No bruits present. Neurologic: Alert and oriented x3. Deep tendon reflexes symmetrical and normal.  Gait normal  . Skin: Intact without suspicious lesions or rashes. Lymph: No cervical, axillary lymphadenopathy present. Psych: Mood and affect are normal. Normally interactive                                                                                       Assessment & Plan:  #1 comprehensive physical exam; no acute findings  Plan: see Orders  & Recommendations

## 2014-02-25 NOTE — Assessment & Plan Note (Signed)
Onset:age 43 Triggers : menses Medications/ response:Imitrex not beneficial;Relpax & Maxalt beneficial ; Relpax not covered by insurance Prodrome:dizziness Aura : no Family history migraine: no

## 2014-02-26 ENCOUNTER — Other Ambulatory Visit (INDEPENDENT_AMBULATORY_CARE_PROVIDER_SITE_OTHER): Payer: 59

## 2014-02-26 DIAGNOSIS — Z0000 Encounter for general adult medical examination without abnormal findings: Secondary | ICD-10-CM

## 2014-02-26 LAB — LIPID PANEL
CHOL/HDL RATIO: 3
Cholesterol: 178 mg/dL (ref 0–200)
HDL: 69.1 mg/dL (ref >39.00)
LDL Cholesterol: 100 mg/dL — ABNORMAL HIGH (ref 0–99)
Triglycerides: 47 mg/dL (ref 0.0–149.0)
VLDL: 9.4 mg/dL (ref 0.0–40.0)

## 2014-02-26 LAB — HEPATIC FUNCTION PANEL
ALBUMIN: 4.5 g/dL (ref 3.5–5.2)
ALK PHOS: 43 U/L (ref 39–117)
ALT: 20 U/L (ref 0–35)
AST: 20 U/L (ref 0–37)
BILIRUBIN DIRECT: 0.1 mg/dL (ref 0.0–0.3)
Total Bilirubin: 0.9 mg/dL (ref 0.3–1.2)
Total Protein: 7.1 g/dL (ref 6.0–8.3)

## 2014-02-26 LAB — CBC WITH DIFFERENTIAL/PLATELET
BASOS ABS: 0 10*3/uL (ref 0.0–0.1)
Basophils Relative: 0.3 % (ref 0.0–3.0)
Eosinophils Absolute: 0.1 10*3/uL (ref 0.0–0.7)
Eosinophils Relative: 1.3 % (ref 0.0–5.0)
HCT: 44.1 % (ref 36.0–46.0)
Hemoglobin: 15 g/dL (ref 12.0–15.0)
LYMPHS PCT: 23 % (ref 12.0–46.0)
Lymphs Abs: 2.1 10*3/uL (ref 0.7–4.0)
MCHC: 34 g/dL (ref 30.0–36.0)
MCV: 93.6 fl (ref 78.0–100.0)
MONOS PCT: 5.9 % (ref 3.0–12.0)
Monocytes Absolute: 0.5 10*3/uL (ref 0.1–1.0)
Neutro Abs: 6.4 10*3/uL (ref 1.4–7.7)
Neutrophils Relative %: 69.5 % (ref 43.0–77.0)
Platelets: 295 10*3/uL (ref 150.0–400.0)
RBC: 4.71 Mil/uL (ref 3.87–5.11)
RDW: 12.8 % (ref 11.5–14.6)
WBC: 9.3 10*3/uL (ref 4.5–10.5)

## 2014-02-26 LAB — BASIC METABOLIC PANEL
BUN: 16 mg/dL (ref 6–23)
CALCIUM: 9.4 mg/dL (ref 8.4–10.5)
CO2: 29 mEq/L (ref 19–32)
Chloride: 104 mEq/L (ref 96–112)
Creatinine, Ser: 0.8 mg/dL (ref 0.4–1.2)
GFR: 84.05 mL/min (ref >60.00)
Glucose, Bld: 103 mg/dL — ABNORMAL HIGH (ref 70–99)
Potassium: 4.7 mEq/L (ref 3.5–5.1)
Sodium: 138 mEq/L (ref 135–145)

## 2014-02-26 LAB — TSH: TSH: 2.95 u[IU]/mL (ref 0.35–5.50)

## 2014-02-27 ENCOUNTER — Encounter: Payer: Self-pay | Admitting: Internal Medicine

## 2014-02-27 DIAGNOSIS — R7309 Other abnormal glucose: Secondary | ICD-10-CM | POA: Insufficient documentation

## 2014-03-01 ENCOUNTER — Telehealth: Payer: Self-pay | Admitting: *Deleted

## 2014-03-01 ENCOUNTER — Ambulatory Visit: Payer: 59

## 2014-03-01 DIAGNOSIS — R7309 Other abnormal glucose: Secondary | ICD-10-CM

## 2014-03-01 LAB — HEMOGLOBIN A1C: HEMOGLOBIN A1C: 5.1 % (ref 4.6–6.5)

## 2014-03-01 NOTE — Telephone Encounter (Signed)
Added HgbA1c to recent labs.//AB/CMA

## 2014-03-01 NOTE — Telephone Encounter (Signed)
Message copied by Harl Bowie on Mon Mar 01, 2014  3:09 PM ------      Message from: Hendricks Limes      Created: Sat Feb 27, 2014  8:54 AM       Please add A1c (790.29)       ------

## 2014-04-19 ENCOUNTER — Other Ambulatory Visit: Payer: Self-pay

## 2014-04-19 MED ORDER — PANTOPRAZOLE SODIUM 40 MG PO TBEC: DELAYED_RELEASE_TABLET | ORAL | Status: DC

## 2014-05-28 ENCOUNTER — Encounter: Payer: Self-pay | Admitting: Internal Medicine

## 2014-06-02 ENCOUNTER — Other Ambulatory Visit: Payer: Self-pay | Admitting: Internal Medicine

## 2014-06-02 NOTE — Telephone Encounter (Signed)
OK X1, R X 2 

## 2014-10-18 ENCOUNTER — Encounter: Payer: Self-pay | Admitting: Internal Medicine

## 2014-11-24 ENCOUNTER — Other Ambulatory Visit: Payer: Self-pay

## 2014-11-24 MED ORDER — PANTOPRAZOLE SODIUM 40 MG PO TBEC: DELAYED_RELEASE_TABLET | ORAL | Status: DC

## 2015-02-10 ENCOUNTER — Other Ambulatory Visit: Payer: Self-pay

## 2015-02-10 MED ORDER — PANTOPRAZOLE SODIUM 40 MG PO TBEC: DELAYED_RELEASE_TABLET | ORAL | Status: DC

## 2015-03-14 ENCOUNTER — Other Ambulatory Visit: Payer: Self-pay

## 2015-03-14 MED ORDER — PANTOPRAZOLE SODIUM 40 MG PO TBEC: DELAYED_RELEASE_TABLET | ORAL | Status: DC

## 2015-04-12 ENCOUNTER — Encounter: Payer: Self-pay | Admitting: Gastroenterology

## 2015-04-14 ENCOUNTER — Other Ambulatory Visit: Payer: Self-pay | Admitting: Internal Medicine

## 2015-04-20 ENCOUNTER — Encounter: Payer: Self-pay | Admitting: Internal Medicine

## 2015-04-20 ENCOUNTER — Ambulatory Visit (INDEPENDENT_AMBULATORY_CARE_PROVIDER_SITE_OTHER): Payer: 59 | Admitting: Internal Medicine

## 2015-04-20 VITALS — BP 126/78 | HR 76 | Temp 98.3°F | Ht 66.75 in | Wt 143.0 lb

## 2015-04-20 DIAGNOSIS — J310 Chronic rhinitis: Secondary | ICD-10-CM | POA: Diagnosis not present

## 2015-04-20 DIAGNOSIS — R05 Cough: Secondary | ICD-10-CM | POA: Diagnosis not present

## 2015-04-20 DIAGNOSIS — R059 Cough, unspecified: Secondary | ICD-10-CM

## 2015-04-20 MED ORDER — AMOXICILLIN 500 MG PO CAPS: 500.0000 mg | ORAL_CAPSULE | Freq: Three times a day (TID) | ORAL | Status: DC

## 2015-04-20 MED ORDER — HYDROCOD POLST-CPM POLST ER 10-8 MG/5ML PO SUER: 5.0000 mL | Freq: Two times a day (BID) | ORAL | Status: DC | PRN

## 2015-04-20 MED ORDER — PANTOPRAZOLE SODIUM 40 MG PO TBEC: DELAYED_RELEASE_TABLET | ORAL | Status: DC

## 2015-04-20 NOTE — Progress Notes (Signed)
Pre visit review using our clinic review tool, if applicable. No additional management support is needed unless otherwise documented below in the visit note. 

## 2015-04-20 NOTE — Progress Notes (Signed)
   Subjective:    Patient ID: Erica Gordon, female    DOB: [birth year], 44 y.o.   MRN: [id number]  HPI Her symptoms began 9 days ago as congestion in her throat. She subsequently developed a cough which is dry except in the early morning. She will bring up small amount of yellow/green sputum in the morning only. The cough is worse when she is talking and when supine. The cough is paroxysmal, lasting up to 5 minutes. She's been using lozenges and hot tea with honey as well as Delsym. The first two treatments have been of the most benefit.  She has no history of asthma. She's never smoked.   She denies any other upper respiratory tract infection symptoms or extrinsic symptoms except for minor sneezing.She has noted some otic pressure.  Review of Systems Frontal headache, facial pain , nasal purulence, dental pain, sore throat , or otic discharge denied. No fever , chills or sweats. Extrinsic symptoms of itchy, watery eyes, or angioedema are denied. There is no wheezing or  paroxysmal nocturnal dyspnea.    Objective:   Physical Exam  General appearance:Thin but adequately nourished; no acute distress or increased work of breathing is present.    Lymphatic: No  lymphadenopathy about the head, neck, or axilla .  Eyes: No conjunctival inflammation or lid edema is present. There is no scleral icterus.  Ears:  External ear exam shows no significant lesions or deformities.  Otoscopic examination reveals clear canals, tympanic membranes are intact bilaterally without bulging, retraction, inflammation or discharge.  Nose:  External nasal examination shows no deformity or inflammation. Nasal mucosa are markedly erythematous without lesions or exudates. There is slight septal dislocation on the left without septal deviation.No obstruction to airflow.   Oral exam: Dental hygiene is good; lips and gums are healthy appearing.There is no oropharyngeal erythema or exudate .  Neck:  No deformities,  thyromegaly, masses, or tenderness noted.   Supple with full range of motion without pain.   Heart:  Normal rate and regular rhythm. S1 and S2 normal without gallop, murmur, click, rub or other extra sounds.   Lungs:Chest clear to auscultation; no wheezes, rhonchi,rales ,or rubs present.  Extremities:  No cyanosis, edema, or clubbing  noted    Skin: Warm & dry w/o tenting or jaundice. No significant lesions or rash.       Assessment & Plan:  #1 nonallergic rhinitis  #2 cough secondary to #1. Scant purulent sputum in the morning or from sequestration overnight.  See orders and after visit summary

## 2015-04-20 NOTE — Patient Instructions (Signed)
Plain Mucinex (NOT D) for thick secretions ;force NON dairy fluids .   Nasal cleansing in the shower as discussed with lather of mild shampoo.After 10 seconds wash off lather while  exhaling through nostrils. Make sure that all residual soap is removed to prevent irritation.  Flonase OR Nasacort AQ 1 spray in each nostril twice a day as needed. Use the "crossover" technique into opposite nostril spraying toward opposite ear @ 45 degree angle, not straight up into nostril.  Plain Allegra (NOT D )  160 daily , Loratidine 10 mg , OR Zyrtec 10 mg @ bedtime  as needed for itchy eyes & sneezing.  Fill the  prescription for antibiotic if fever, discolored nasal or chest secretions or significant pain above & below eyes appear in the next 48-72 hours. 

## 2015-05-02 ENCOUNTER — Other Ambulatory Visit (INDEPENDENT_AMBULATORY_CARE_PROVIDER_SITE_OTHER): Payer: 59

## 2015-05-02 ENCOUNTER — Encounter: Payer: Self-pay | Admitting: Internal Medicine

## 2015-05-02 ENCOUNTER — Ambulatory Visit (INDEPENDENT_AMBULATORY_CARE_PROVIDER_SITE_OTHER): Payer: 59 | Admitting: Internal Medicine

## 2015-05-02 VITALS — BP 118/78 | HR 77 | Temp 98.0°F | Resp 14 | Wt 143.5 lb

## 2015-05-02 DIAGNOSIS — G43C0 Periodic headache syndromes in child or adult, not intractable: Secondary | ICD-10-CM | POA: Diagnosis not present

## 2015-05-02 DIAGNOSIS — R05 Cough: Secondary | ICD-10-CM

## 2015-05-02 DIAGNOSIS — Z0000 Encounter for general adult medical examination without abnormal findings: Secondary | ICD-10-CM | POA: Diagnosis not present

## 2015-05-02 DIAGNOSIS — Z0189 Encounter for other specified special examinations: Secondary | ICD-10-CM | POA: Diagnosis not present

## 2015-05-02 DIAGNOSIS — R059 Cough, unspecified: Secondary | ICD-10-CM

## 2015-05-02 LAB — CBC WITH DIFFERENTIAL/PLATELET
BASOS PCT: 0.3 % (ref 0.0–3.0)
Basophils Absolute: 0 10*3/uL (ref 0.0–0.1)
Eosinophils Absolute: 0.1 10*3/uL (ref 0.0–0.7)
Eosinophils Relative: 2 % (ref 0.0–5.0)
HCT: 43.3 % (ref 36.0–46.0)
Hemoglobin: 14.9 g/dL (ref 12.0–15.0)
Lymphocytes Relative: 26.5 % (ref 12.0–46.0)
Lymphs Abs: 1.9 10*3/uL (ref 0.7–4.0)
MCHC: 34.5 g/dL (ref 30.0–36.0)
MCV: 90.3 fl (ref 78.0–100.0)
MONO ABS: 0.4 10*3/uL (ref 0.1–1.0)
MONOS PCT: 5.7 % (ref 3.0–12.0)
NEUTROS ABS: 4.6 10*3/uL (ref 1.4–7.7)
Neutrophils Relative %: 65.5 % (ref 43.0–77.0)
Platelets: 249 10*3/uL (ref 150.0–400.0)
RBC: 4.8 Mil/uL (ref 3.87–5.11)
RDW: 12.3 % (ref 11.5–15.5)
WBC: 7.1 10*3/uL (ref 4.0–10.5)

## 2015-05-02 LAB — HEPATIC FUNCTION PANEL
ALBUMIN: 4 g/dL (ref 3.5–5.2)
ALK PHOS: 48 U/L (ref 39–117)
ALT: 13 U/L (ref 0–35)
AST: 16 U/L (ref 0–37)
Bilirubin, Direct: 0.2 mg/dL (ref 0.0–0.3)
TOTAL PROTEIN: 6.7 g/dL (ref 6.0–8.3)
Total Bilirubin: 0.9 mg/dL (ref 0.2–1.2)

## 2015-05-02 LAB — LIPID PANEL
CHOL/HDL RATIO: 3
Cholesterol: 178 mg/dL (ref 0–200)
HDL: 62.3 mg/dL (ref >39.00)
LDL CALC: 98 mg/dL (ref 0–99)
NonHDL: 115.7
TRIGLYCERIDES: 90 mg/dL (ref 0.0–149.0)
VLDL: 18 mg/dL (ref 0.0–40.0)

## 2015-05-02 LAB — BASIC METABOLIC PANEL
BUN: 15 mg/dL (ref 6–23)
CHLORIDE: 101 meq/L (ref 96–112)
CO2: 32 mEq/L (ref 19–32)
Calcium: 9.7 mg/dL (ref 8.4–10.5)
Creatinine, Ser: 0.83 mg/dL (ref 0.40–1.20)
GFR: 78.97 mL/min (ref >60.00)
Glucose, Bld: 98 mg/dL (ref 70–99)
POTASSIUM: 4.5 meq/L (ref 3.5–5.1)
Sodium: 137 mEq/L (ref 135–145)

## 2015-05-02 LAB — TSH: TSH: 1.66 u[IU]/mL (ref 0.35–4.50)

## 2015-05-02 MED ORDER — HYDROCOD POLST-CPM POLST ER 10-8 MG/5ML PO SUER: 5.0000 mL | Freq: Two times a day (BID) | ORAL | Status: DC | PRN

## 2015-05-02 MED ORDER — RIZATRIPTAN BENZOATE 10 MG PO TABS: ORAL_TABLET | ORAL | Status: DC

## 2015-05-02 NOTE — Patient Instructions (Signed)
  Your next office appointment will be determined based upon review of your pending labs . Those instructions will be transmitted to you by My Chart Critical results will be called. Followup as needed for any active or acute issue. Please report any significant change in your symptoms.  Excedrin Migraine can be used to prevent migraine headaches if taken at the onset of any prodrome or non-headache symptom.

## 2015-05-02 NOTE — Progress Notes (Signed)
Subjective:    Patient ID: Erica Gordon, female    DOB: [birth year], 44 y.o.   MRN: [id number]  HPI  She is here for a physical;acute issues include an active respiratory tract infection which is resolving.  On 5/8 she developed a sore throat; she performed a strep test in her office which was negative. She did not start Amoxicillin  until 04/29/15 as she thought she was improving. On 5/13 she had "throbbing left lymph node and ear pain".  She is on a heart healthy diet. She runs 3 miles 3 times a week without cardiopulmonary symptoms. Colonoscopy was last performed 2011. She has no family history of colon cancer.    Review of Systems Frontal headache, facial pain , nasal purulence, dental pain, sore throat , otic pain or otic discharge denied. No fever , chills or sweats.  Unexplained weight loss, abdominal pain, significant dyspepsia, dysphagia, melena, or persistently small caliber stools are denied. 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  She has migraines on average once a month typically following her menses. She averages taking 3 Maxalt per month. She does have a prodrome of "fuzzy thinking". She has no associated aura.    Objective:   Physical Exam  Gen.: Thin but adequately nourished in appearance. Alert, appropriate and cooperative throughout exam.  Appears younger than stated age  Head: Normocephalic without obvious abnormalities Eyes:No corneal or conjunctival inflammation noted. Pupils equal round reactive to light and accommodation. Extraocular motion intact.  Ears: External  ear exam reveals no significant lesions or deformities. Canals clear .TMs normal. Hearing is grossly normal bilaterally. Nose: External nasal exam reveals no deformity or inflammation. Nasal mucosa are pink and moist. No lesions or exudates noted.   Mouth: Oral mucosa and oropharynx reveal no lesions or exudates. Teeth in good repair. Neck: No deformities, masses,  or tenderness noted. Range of motion &. Thyroid normal. Lungs: Normal respiratory effort; chest expands symmetrically. Lungs are clear to auscultation without rales, wheezes, or increased work of breathing. Heart: Normal rate and rhythm. Normal S1 and S2. No gallop, click, or rub. No murmur. Abdomen: Bowel sounds normal; abdomen soft and nontender. No masses, organomegaly or hernias noted. Aorta is palpable; there is no aneurysm. Genitalia: as per Gyn                                  Musculoskeletal/extremities: No deformity or scoliosis noted of  the thoracic or lumbar spine.  No clubbing, cyanosis, edema, or significant extremity  deformity noted.  Range of motion normal . Tone & strength normal. Hand joints normal Fingernail  health good. Able to lie down & sit up w/o help.  Negative SLR bilaterally Vascular: Carotid, radial artery, dorsalis pedis and  posterior tibial pulses are equal. Slightly decreased pedal pulses ,especially DPP.No bruits present. Neurologic: Alert and oriented x3. Deep tendon reflexes symmetrical and normal.  Gait normal      Skin: Intact without suspicious lesions or rashes. Lymph: No cervical, axillary lymphadenopathy present. Psych: Mood and affect are normal. Normally interactive  Assessment & Plan:  #1 comprehensive physical exam; no acute findings  Plan: see Orders  & Recommendations

## 2015-05-02 NOTE — Progress Notes (Signed)
Pre visit review using our clinic review tool, if applicable. No additional management support is needed unless otherwise documented below in the visit note. 

## 2015-05-09 ENCOUNTER — Other Ambulatory Visit: Payer: Self-pay | Admitting: Internal Medicine

## 2015-05-09 ENCOUNTER — Telehealth: Payer: Self-pay | Admitting: Internal Medicine

## 2015-05-09 MED ORDER — AZITHROMYCIN 250 MG PO TABS: ORAL_TABLET | ORAL | Status: DC

## 2015-05-09 NOTE — Telephone Encounter (Signed)
Please advise, thanks.

## 2015-05-09 NOTE — Telephone Encounter (Signed)
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 She does not have symptoms, but is requesting an antibiotic [facility]. CB# [phone]

## 2015-06-29 ENCOUNTER — Other Ambulatory Visit: Payer: Self-pay | Admitting: Emergency Medicine

## 2015-06-29 ENCOUNTER — Other Ambulatory Visit: Payer: Self-pay | Admitting: Internal Medicine

## 2015-06-29 DIAGNOSIS — G43C0 Periodic headache syndromes in child or adult, not intractable: Secondary | ICD-10-CM

## 2015-06-29 MED ORDER — RIZATRIPTAN BENZOATE 10 MG PO TABS: ORAL_TABLET | ORAL | Status: DC

## 2015-08-11 ENCOUNTER — Other Ambulatory Visit: Payer: Self-pay | Admitting: Internal Medicine

## 2015-12-10 ENCOUNTER — Other Ambulatory Visit: Payer: Self-pay | Admitting: Internal Medicine

## 2016-01-17 ENCOUNTER — Telehealth: Payer: Self-pay | Admitting: Internal Medicine

## 2016-01-17 NOTE — Telephone Encounter (Signed)
lmovm for pt to call back to establish care with another PCP for 90 day refill request

## 2016-04-10 ENCOUNTER — Telehealth: Payer: Self-pay | Admitting: Internal Medicine

## 2016-04-10 NOTE — Telephone Encounter (Signed)
Pt request refill for pantoprazole (PROTONIX) 40 MG tablet to [facility], advise pharmacy to tell the pt to contact our office since she has not make an appt with another PCP here. Please help, she is out

## 2016-04-11 NOTE — Telephone Encounter (Signed)
Left message asking patient to call back to make appt/get established with new pcp---let Abrar Koone know when appt is made and refill for protonix can be sent to patient pharm

## 2016-07-09 ENCOUNTER — Other Ambulatory Visit: Payer: Self-pay | Admitting: Internal Medicine

## 2016-07-09 DIAGNOSIS — G43C0 Periodic headache syndromes in child or adult, not intractable: Secondary | ICD-10-CM

## 2016-07-16 ENCOUNTER — Telehealth: Payer: Self-pay | Admitting: Internal Medicine

## 2016-07-16 ENCOUNTER — Other Ambulatory Visit: Payer: Self-pay

## 2016-07-16 DIAGNOSIS — G43C0 Periodic headache syndromes in child or adult, not intractable: Secondary | ICD-10-CM

## 2016-07-16 MED ORDER — RIZATRIPTAN BENZOATE 10 MG PO TABS: ORAL_TABLET | ORAL | 2 refills | Status: DC

## 2016-07-16 NOTE — Telephone Encounter (Signed)
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 She denies having cluster migraines. She does have an appointment to see Dr. Yong Channel in August. Prescription refill should get her to her next appointment.

## 2016-07-16 NOTE — Telephone Encounter (Signed)
Pt need new Rx for rizatriptan (MAXALT)   Pharm:  [facility]   Pt has a appointment with Dr. Yong Channel 8/25

## 2016-07-20 ENCOUNTER — Ambulatory Visit: Payer: 59 | Admitting: Family Medicine

## 2016-08-10 ENCOUNTER — Ambulatory Visit (INDEPENDENT_AMBULATORY_CARE_PROVIDER_SITE_OTHER): Payer: 59 | Admitting: Family Medicine

## 2016-08-10 ENCOUNTER — Encounter: Payer: Self-pay | Admitting: Family Medicine

## 2016-08-10 VITALS — BP 120/88 | HR 72 | Temp 98.3°F | Ht 66.25 in | Wt 141.6 lb

## 2016-08-10 DIAGNOSIS — G43C0 Periodic headache syndromes in child or adult, not intractable: Secondary | ICD-10-CM | POA: Diagnosis not present

## 2016-08-10 DIAGNOSIS — K219 Gastro-esophageal reflux disease without esophagitis: Secondary | ICD-10-CM

## 2016-08-10 DIAGNOSIS — Z0000 Encounter for general adult medical examination without abnormal findings: Secondary | ICD-10-CM | POA: Diagnosis not present

## 2016-08-10 DIAGNOSIS — I7300 Raynaud's syndrome without gangrene: Secondary | ICD-10-CM | POA: Insufficient documentation

## 2016-08-10 NOTE — Progress Notes (Signed)
Phone: [phone]  Subjective:  Patient presents today to establish care with me as their new primary care provider. Patient was formerly a patient of Dr. Linna Darner. Chief complaint-noted.   See problem oriented charting ROS- full ROS completed and negative including No chest pain or shortness of breath. No blurry vision.   The following were reviewed and entered/updated in epic: Past Medical History:  Diagnosis Date  . 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 patient states more of PMS  . Gilbert syndrome    noted by Dr. Linna Darner. last bilirubin ok  . H/O dysmenorrhea   . H/O menorrhagia    ablation 2011  . Headache(784.0)    migraine  . 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 GERD 01/20/2009    Priority: Medium  . Migraine 01/20/2009    Priority: Medium  . 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 Blood in stool 07/19/2010    Priority: Low  . IBS 04/21/2010    Priority: Low   Past Surgical History:  Procedure Laterality Date  . COLONOSCOPY  2011   rectal bleeding; "runner's colitis"  . DILATION AND CURETTAGE OF UTERUS     Dr Raphael Gibney  . HYSTEROSCOPY  2011   with polypectomy & ablation  . NOVASURE ABLATION  2011   endometrial   . WISDOM TOOTH EXTRACTION      Family History  Problem Relation Age of Onset  . Cancer Mother     breast cancer & NHL  . Cancer Maternal Grandmother     endometrial  . Hyperlipidemia Paternal Grandfather   . Heart disease Paternal Grandfather     pacer  . Heart disease Father 84     4 vessel CBAG  . Prostate cancer Father   . Hypothyroidism Father   . Hyperthyroidism Sister   . Prostate cancer Brother   . Stroke Neg Hx   . Diabetes Neg Hx   . Ulcers Neg Hx     Medications- reviewed and updated Current Outpatient Prescriptions  Medication Sig Dispense Refill  . Calcium-Magnesium-Vitamin D (CALCIUM MAGNESIUM PO) Take by mouth. 2 by mouth at bedtime     . Cholecalciferol  (VITAMIN D3) 2000 UNITS TABS Take by mouth daily.      Marland Kitchen ibuprofen (ADVIL,MOTRIN) 200 MG tablet Take 200 mg by mouth as needed.      . loratadine (CLARITIN) 10 MG tablet Take 10 mg by mouth daily.    . Multiple Vitamin (MULTIVITAMIN) tablet Take 1 tablet by mouth daily.      . OMEGA 3 1000 MG CAPS Take by mouth daily.      Marland Kitchen omeprazole (PRILOSEC) 40 MG capsule Take 40 mg by mouth daily.    . rizatriptan (MAXALT) 10 MG tablet TAKE 1 TABLET BY MOUTH AS NEEDED FOR MIGRAINE. MAY REPEAT IN 2 HOURS IF NEEDED 9 tablet 2   Allergies-reviewed and updated Allergies  Allergen Reactions  . Clarithromycin     REACTION: METALLIC TASTE    Social History   Social History  . Marital status: Married    Spouse name: N/A  . Number of children: N/A  . Years of education: N/A   Social History Main Topics  . Smoking status: Never Smoker  . Smokeless tobacco: None  . Alcohol use 4.8 oz/week    8 Glasses of wine per week  . Drug use: No  . Sexual activity: Not Asked  Other Topics Concern  . None   Social History Narrative   Married 1995. 2 sons- 15 and 64 in 2017.       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 Oropharynx normal Neck: no thyromegaly CV: RRR no murmurs rubs or gallops Lungs: CTAB no crackles, wheeze, rhonchi Abdomen: soft/nontender/nondistended/normal bowel sounds. No rebound or guarding.  Ext: no edema Skin: warm, dry Neuro: grossly normal, moves all extremities, PERRLA  Assessment/Plan:  45 y.o. female presenting for annual physical.    Health Maintenance counseling: 1. Anticipatory guidance: Patient counseled regarding regular dental exams, eye exams, wearing seatbelts.  2. Risk factor reduction:  Advised patient of need for regular exercise and diet rich and fruits and vegetables to reduce risk of heart attack and stroke. Getting back into it.  3. Immunizations/screenings/ancillary studies Immunization History  Administered Date(s) Administered  . Influenza-Unspecified 09/16/2013, 09/17/2014  . Td 10/22/2006   Health Maintenance Due  Topic Date Due  . HIV Screening - decline  03/19/1985  . INFLUENZA VACCINE - through work 07/17/2016   4. Cervical cancer screening- pap smear in 2017 planned, normal 3 years ago 5. Breast cancer screening-  breast exam through GYN and mammogram yearly due to family history 6. Colon cancer screening - early due to rectal bleeding- diagnosed runners colitis 7. Skin cancer screening- Dr. Allyn Kenner yearly   GERD S: history of reflux. Brother on protonix and she was later started on this. She stopped taking this and taking omeprazole and would like to get off if it completely- if misses a dose she is miserable. Gets a burning retrosternal sensation after meals particularly in evening. Gets a chronic cough if off medicine. Breakthrough on zantac in past.  A/P: decided to continue PPI  Migraine S: has to take maxalt 3 days usually once a month usually right after cycle or ovulation time. Sometimes can just take ibuprofen.  A/P: continue as needed maxalt  1 year Physical   Orders Placed This Encounter  Procedures  . CBC    Standing Status:   Future    Standing Expiration Date:   08/10/2017  . Comprehensive metabolic panel    [facility]    Standing Status:   Future    Standing Expiration Date:   08/10/2017  . Lipid panel    Standing Status:   Future    Standing Expiration Date:   08/10/2017  . TSH    Standing Status:   Future    Standing Expiration Date:   08/10/2017    Meds ordered  this encounter  Medications  . omeprazole (PRILOSEC) 40 MG capsule    Sig: Take 40 mg by mouth daily.  Marland Kitchen loratadine (CLARITIN) 10 MG tablet    Sig: Take 10 mg by mouth daily.   Return precautions advised.   Garret Reddish, MD

## 2016-08-10 NOTE — Patient Instructions (Addendum)
Please send me a message when you get your flu shot  Let me know if you got Tdap sooner than 2007  Schedule a lab visit at the check out desk within 2 weeks. Return for future fasting labs meaning nothing but water after midnight please. Ok to take your medications with water.

## 2016-08-10 NOTE — Assessment & Plan Note (Signed)
S: history of reflux. Brother on protonix and she was later started on this. She stopped taking this and taking omeprazole and would like to get off if it completely- if misses a dose she is miserable. Gets a burning retrosternal sensation after meals particularly in evening. Gets a chronic cough if off medicine. Breakthrough on zantac in past.  A/P: decided to continue PPI

## 2016-08-10 NOTE — Assessment & Plan Note (Signed)
S: has to take maxalt 3 days usually once a month usually right after cycle or ovulation time. Sometimes can just take ibuprofen.  A/P: continue as needed maxalt

## 2016-08-10 NOTE — Progress Notes (Signed)
Pre visit review using our clinic review tool, if applicable. No additional management support is needed unless otherwise documented below in the visit note. 

## 2016-09-07 ENCOUNTER — Other Ambulatory Visit (INDEPENDENT_AMBULATORY_CARE_PROVIDER_SITE_OTHER): Payer: 59

## 2016-09-07 DIAGNOSIS — Z0000 Encounter for general adult medical examination without abnormal findings: Secondary | ICD-10-CM

## 2016-09-07 LAB — LIPID PANEL
CHOL/HDL RATIO: 3
CHOLESTEROL: 182 mg/dL (ref 0–200)
HDL: 60.1 mg/dL (ref >39.00)
LDL Cholesterol: 109 mg/dL — ABNORMAL HIGH (ref 0–99)
NonHDL: 122.38
TRIGLYCERIDES: 66 mg/dL (ref 0.0–149.0)
VLDL: 13.2 mg/dL (ref 0.0–40.0)

## 2016-09-07 LAB — COMPREHENSIVE METABOLIC PANEL
ALBUMIN: 4 g/dL (ref 3.5–5.2)
ALT: 10 U/L (ref 0–35)
AST: 12 U/L (ref 0–37)
Alkaline Phosphatase: 37 U/L — ABNORMAL LOW (ref 39–117)
BILIRUBIN TOTAL: 0.7 mg/dL (ref 0.2–1.2)
BUN: 21 mg/dL (ref 6–23)
CALCIUM: 8.9 mg/dL (ref 8.4–10.5)
CO2: 29 meq/L (ref 19–32)
Chloride: 105 mEq/L (ref 96–112)
Creatinine, Ser: 0.94 mg/dL (ref 0.40–1.20)
GFR: 68 mL/min (ref >60.00)
Glucose, Bld: 88 mg/dL (ref 70–99)
Potassium: 5 mEq/L (ref 3.5–5.1)
Sodium: 140 mEq/L (ref 135–145)
Total Protein: 6.3 g/dL (ref 6.0–8.3)

## 2016-09-07 LAB — CBC
HCT: 40.2 % (ref 36.0–46.0)
HEMOGLOBIN: 14 g/dL (ref 12.0–15.0)
MCHC: 34.8 g/dL (ref 30.0–36.0)
MCV: 90.2 fl (ref 78.0–100.0)
PLATELETS: 231 10*3/uL (ref 150.0–400.0)
RBC: 4.45 Mil/uL (ref 3.87–5.11)
RDW: 12.8 % (ref 11.5–15.5)
WBC: 6 10*3/uL (ref 4.0–10.5)

## 2016-09-07 LAB — TSH: TSH: 2.08 u[IU]/mL (ref 0.35–4.50)

## 2016-10-04 LAB — HM PAP SMEAR

## 2016-10-12 ENCOUNTER — Encounter: Payer: Self-pay | Admitting: Family Medicine

## 2016-10-18 ENCOUNTER — Encounter: Payer: Self-pay | Admitting: Family Medicine

## 2017-03-22 ENCOUNTER — Ambulatory Visit: Payer: 59 | Admitting: Internal Medicine

## 2017-03-22 ENCOUNTER — Encounter: Payer: Self-pay | Admitting: Family Medicine

## 2017-03-22 ENCOUNTER — Ambulatory Visit (INDEPENDENT_AMBULATORY_CARE_PROVIDER_SITE_OTHER): Payer: 59 | Admitting: Family Medicine

## 2017-03-22 VITALS — BP 138/82 | HR 96 | Temp 99.0°F | Ht 66.25 in | Wt 145.4 lb

## 2017-03-22 DIAGNOSIS — L255 Unspecified contact dermatitis due to plants, except food: Secondary | ICD-10-CM | POA: Diagnosis not present

## 2017-03-22 MED ORDER — PREDNISONE 20 MG PO TABS: ORAL_TABLET | ORAL | 0 refills | Status: DC

## 2017-03-22 NOTE — Progress Notes (Signed)
Subjective:  Erica Gordon is a 46 y.o. year old very pleasant female patient who presents for/with See problem oriented charting ROS- no fever, chills. not ill appearing, no fever/chills. No new medications. Not immunocompromised. No mucus membrane involvement.    Past Medical History-  Patient Active Problem List   Diagnosis Date Noted  . GERD 01/20/2009    Priority: Medium  . Migraine 01/20/2009    Priority: Medium  . 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 Blood in stool 07/19/2010    Priority: Low  . IBS 04/21/2010    Priority: Low  . Raynaud phenomenon 08/10/2016    Medications- reviewed and updated Current Outpatient Prescriptions  Medication Sig Dispense Refill  . Calcium-Magnesium-Vitamin D (CALCIUM MAGNESIUM PO) Take by mouth. 2 by mouth at bedtime     . Cholecalciferol (VITAMIN D3) 2000 UNITS TABS Take by mouth daily.      Marland Kitchen ibuprofen (ADVIL,MOTRIN) 200 MG tablet Take 200 mg by mouth as needed.      . loratadine (CLARITIN) 10 MG tablet Take 10 mg by mouth daily.    . Multiple Vitamin (MULTIVITAMIN) tablet Take 1 tablet by mouth daily.      . OMEGA 3 1000 MG CAPS Take by mouth daily.      Marland Kitchen omeprazole (PRILOSEC) 40 MG capsule Take 40 mg by mouth daily.    . rizatriptan (MAXALT) 10 MG tablet TAKE 1 TABLET BY MOUTH AS NEEDED FOR MIGRAINE. MAY REPEAT IN 2 HOURS IF NEEDED 9 tablet 2   No current facility-administered medications for this visit.     Objective: BP 138/82 (BP Location: Right Arm, Patient Position: Sitting, Cuff Size: Normal)   Pulse 96   Temp 99 F (37.2 C) (Oral)   Ht 5' 6.25" (1.683 m)   Wt 145 lb 6.4 oz (66 kg)   SpO2 99%   BMI 23.29 kg/m  Gen: NAD, resting comfortably CV: RRR no murmurs rubs or gallops Lungs: CTAB no crackles, wheeze, rhonchi Ext: no edema Skin: warm, dry. Rash noted-  left arm at least 10 x 8 cm area of erythema with excoriations and several vesicles, some appear to be scabbed over on upper arm, smaller  vesicular patches on right and left lower arm. Some on back and lower abdomen. Excoriation noted in several areas.  Neuro: grossly normal, moves all extremities  Assessment/Plan:  Contact dermatitis due to plant S:  working in yard on Wednesday and by Friday extensive rash on left upper arm and onto right arm, lower back, and lower abdomen. Husband also had similar but less extensive rash. Very itchy and has scratched multiple time. Left upper arm as weeped at times- confluent large patch noted- not worsening/no expanding redness A/P: extensive rash and multiple areas of body affected. Opted for 3 week taper of prednisone as per below. She is to return if not improving or worsens or recurrence once off medicine though woudlnt expect with 3 week taper  Meds ordered this encounter  Medications  . predniSONE (DELTASONE) 20 MG tablet    Sig: Take 2 pills for 5 days, 1 pill for 5 days, 1/2 pill for 5 days, 1/2 pill every other day until finished    Dispense:  19 tablet    Refill:  0    Return precautions advised.  Garret Reddish, MD

## 2017-03-22 NOTE — Progress Notes (Signed)
Pre visit review using our clinic review tool, if applicable. No additional management support is needed unless otherwise documented below in the visit note. 

## 2017-03-22 NOTE — Patient Instructions (Signed)
Prednisone course prescribed   Poison Ivy Dermatitis Poison ivy dermatitis is inflammation of the skin that is caused by the allergens on the leaves of the poison ivy plant. The skin reaction often involves redness, swelling, blisters, and extreme itching. What are the causes? This condition is caused by a specific chemical (urushiol) found in the sap of the poison ivy plant. This chemical is sticky and can be easily spread to people, animals, and objects. You can get poison ivy dermatitis by:  Having direct contact with a poison ivy plant.  Touching animals, other people, or objects that have come in contact with poison ivy and have the chemical on them. What increases the risk? This condition is more likely to develop in:  People who are outdoors often.  People who go outdoors without wearing protective clothing, such as closed shoes, long pants, and a long-sleeved shirt. What are the signs or symptoms? Symptoms of this condition include:  Redness and itching.  A rash that often includes bumps and blisters. The rash usually appears 48 hours after exposure.  Swelling. This may occur if the reaction is more severe. Symptoms usually last for 1-2 weeks. However, the first time you develop this condition, symptoms may last 3-4 weeks. How is this diagnosed? This condition may be diagnosed based on your symptoms and a physical exam. Your health care provider may also ask you about any recent outdoor activity. How is this treated? Treatment for this condition will vary depending on how severe it is. Treatment may include:  Hydrocortisone creams or calamine lotions to relieve itching.  Oatmeal baths to soothe the skin.  Over-the-counter antihistamine tablets.  Oral steroid medicine for more severe outbreaks. Follow these instructions at home:  Take or apply over-the-counter and prescription medicines only as told by your health care provider.  Wash exposed skin as soon as possible  with soap and cold water.  Use hydrocortisone creams or calamine lotion as needed to soothe the skin and relieve itching.  Take oatmeal baths as needed. Use colloidal oatmeal. You can get this at your local pharmacy or grocery store. Follow the instructions on the packaging.  Do not scratch or rub your skin.  While you have the rash, wash clothes right after you wear them. How is this prevented?  Learn to identify the poison ivy plant and avoid contact with the plant. This plant can be recognized by the number of leaves. Generally, poison ivy has three leaves with flowering branches on a single stem. The leaves are typically glossy, and they have jagged edges that come to a point at the front.  If you have been exposed to poison ivy, thoroughly wash with soap and water right away. You have about 30 minutes to remove the plant resin before it will cause the rash. Be sure to wash under your fingernails because any plant resin there will continue to spread the rash.  When hiking or camping, wear clothes that will help you to avoid exposure on the skin. This includes long pants, a long-sleeved shirt, tall socks, and hiking boots. You can also apply preventive lotion to your skin to help limit exposure.  If you suspect that your clothes or outdoor gear came in contact with poison ivy, rinse them off outside with a garden hose before you bring them inside your house. Contact a health care provider if:  You have open sores in the rash area.  You have more redness, swelling, or pain in the affected area.  You  have redness that spreads beyond the rash area.  You have fluid, blood, or pus coming from the affected area.  You have a fever.  You have a rash over a large area of your body.  You have a rash on your eyes, mouth, or genitals.  Your rash does not improve after a few days. Get help right away if:  Your face swells or your eyes swell shut.  You have trouble breathing.  You have  trouble swallowing. This information is not intended to replace advice given to you by your health care provider. Make sure you discuss any questions you have with your health care provider. Document Released: 11/30/2000 Document Revised: 05/10/2016 Document Reviewed: 05/11/2015 Elsevier Interactive Patient Education  2017 [employer].

## 2017-06-12 ENCOUNTER — Other Ambulatory Visit: Payer: Self-pay | Admitting: Family Medicine

## 2017-06-12 DIAGNOSIS — G43C0 Periodic headache syndromes in child or adult, not intractable: Secondary | ICD-10-CM

## 2017-08-28 DIAGNOSIS — L821 Other seborrheic keratosis: Secondary | ICD-10-CM | POA: Diagnosis not present

## 2017-08-28 DIAGNOSIS — Z1283 Encounter for screening for malignant neoplasm of skin: Secondary | ICD-10-CM | POA: Diagnosis not present

## 2017-10-03 DIAGNOSIS — Z23 Encounter for immunization: Secondary | ICD-10-CM | POA: Diagnosis not present

## 2017-10-11 ENCOUNTER — Telehealth: Payer: Self-pay | Admitting: Family Medicine

## 2017-10-11 NOTE — Telephone Encounter (Signed)
Pt needed to reschedule her appointment for a physical to a later date in the afternoon due to availability. She wanted to know if she would be able to come in to have her labs done the morning of her visit so that they would be done fasting.

## 2017-10-11 NOTE — Telephone Encounter (Signed)
I would be okay with that.  Please order CBC, CMP, lipid panel under hyperlipidemia and preventative healthcare.  Please also tell patient she could come by on a day after her physical for labs and then we can send her results through my chart.  I am more confident of insurance coverage using the second option but both will likely cover it as I am giving instructions on labs to order

## 2017-10-14 ENCOUNTER — Other Ambulatory Visit: Payer: Self-pay

## 2017-10-14 DIAGNOSIS — Z1231 Encounter for screening mammogram for malignant neoplasm of breast: Secondary | ICD-10-CM | POA: Diagnosis not present

## 2017-10-14 DIAGNOSIS — E785 Hyperlipidemia, unspecified: Secondary | ICD-10-CM

## 2017-10-14 DIAGNOSIS — Z01419 Encounter for gynecological examination (general) (routine) without abnormal findings: Secondary | ICD-10-CM | POA: Diagnosis not present

## 2017-10-14 DIAGNOSIS — Z6823 Body mass index (BMI) 23.0-23.9, adult: Secondary | ICD-10-CM | POA: Diagnosis not present

## 2017-10-14 NOTE — Telephone Encounter (Signed)
Called and left a voicemail message asking for a return phone call 

## 2017-10-14 NOTE — Telephone Encounter (Signed)
Orders placed.

## 2017-10-18 ENCOUNTER — Encounter: Payer: Self-pay | Admitting: *Deleted

## 2017-10-18 NOTE — Telephone Encounter (Addendum)
Left VM asking for return call. Also sent message to patient through [place].

## 2017-10-23 ENCOUNTER — Encounter: Payer: 59 | Admitting: Family Medicine

## 2017-10-28 ENCOUNTER — Encounter: Payer: Self-pay | Admitting: Family Medicine

## 2017-10-28 ENCOUNTER — Ambulatory Visit (INDEPENDENT_AMBULATORY_CARE_PROVIDER_SITE_OTHER): Payer: 59 | Admitting: Family Medicine

## 2017-10-28 VITALS — BP 120/82 | HR 87 | Temp 98.7°F | Ht 66.25 in | Wt 146.6 lb

## 2017-10-28 DIAGNOSIS — G43C0 Periodic headache syndromes in child or adult, not intractable: Secondary | ICD-10-CM | POA: Diagnosis not present

## 2017-10-28 DIAGNOSIS — Z8349 Family history of other endocrine, nutritional and metabolic diseases: Secondary | ICD-10-CM | POA: Diagnosis not present

## 2017-10-28 DIAGNOSIS — E785 Hyperlipidemia, unspecified: Secondary | ICD-10-CM

## 2017-10-28 DIAGNOSIS — K219 Gastro-esophageal reflux disease without esophagitis: Secondary | ICD-10-CM

## 2017-10-28 DIAGNOSIS — Z0000 Encounter for general adult medical examination without abnormal findings: Secondary | ICD-10-CM | POA: Diagnosis not present

## 2017-10-28 MED ORDER — RIZATRIPTAN BENZOATE 10 MG PO TABS: ORAL_TABLET | ORAL | 11 refills | Status: DC

## 2017-10-28 NOTE — Assessment & Plan Note (Signed)
Migraines- prn maxalt  Usually around her cycle. usually twice a month

## 2017-10-28 NOTE — Progress Notes (Addendum)
Phone: [phone]  Subjective:  Patient presents today for their annual physical. Chief complaint-noted.   See problem oriented charting- ROS- full  review of systems was completed and negative except for: intermittent migraines, reflux symptoms  The following were reviewed and entered/updated in epic: Past Medical History:  Diagnosis Date  . 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 patient states more of PMS  . Gilbert syndrome    noted by Dr. Linna Darner. last bilirubin ok  . H/O dysmenorrhea   . H/O menorrhagia    ablation 2011  . Headache(784.0)    migraine  . 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 GERD 01/20/2009    Priority: Medium  . Migraine 01/20/2009    Priority: Medium  . 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 Blood in stool 07/19/2010    Priority: Low  . IBS 04/21/2010    Priority: Low  . Hyperlipidemia 10/28/2017  . Raynaud phenomenon 08/10/2016   Past Surgical History:  Procedure Laterality Date  . COLONOSCOPY  2011   rectal bleeding; "runner's colitis"  . DILATION AND CURETTAGE OF UTERUS     Dr Raphael Gibney  . HYSTEROSCOPY  2011   with polypectomy & ablation  . NOVASURE ABLATION  2011   endometrial   . WISDOM TOOTH EXTRACTION      Family History  Problem Relation Age of Onset  . Cancer Mother        breast cancer & NHL  . Cancer Maternal Grandmother        endometrial  . Hyperlipidemia Paternal Grandfather   . Heart disease Paternal Grandfather        pacer  . Heart disease Father 31        4 vessel CBAG  . Prostate cancer Father   . Hypothyroidism Father   . Hyperthyroidism Sister   . Prostate cancer Brother   . Stroke Neg Hx   . Diabetes Neg Hx   . Ulcers Neg Hx     Medications- reviewed and updated Current Outpatient Medications  Medication Sig Dispense Refill  . Calcium-Magnesium-Vitamin D (CALCIUM MAGNESIUM PO) Take by mouth. 2 by mouth at bedtime     . Cholecalciferol (VITAMIN  D3) 2000 UNITS TABS Take by mouth daily.      Marland Kitchen ibuprofen (ADVIL,MOTRIN) 200 MG tablet Take 200 mg by mouth as needed.      . loratadine (CLARITIN) 10 MG tablet Take 10 mg by mouth daily.    . Multiple Vitamin (MULTIVITAMIN) tablet Take 1 tablet by mouth daily.      . OMEGA 3 1000 MG CAPS Take by mouth daily.      Marland Kitchen omeprazole (PRILOSEC) 40 MG capsule Take 40 mg by mouth daily.    . rizatriptan (MAXALT) 10 MG tablet TAKE 1 TABLET BY MOUTH AS NEEDED FOR MIGRAINE. MAY REPEAT IN 2 HOURS IF NEEDED 9 tablet 11   No current facility-administered medications for this visit.     Allergies-reviewed and updated Allergies  Allergen Reactions  . Clarithromycin     REACTION: METALLIC TASTE    Social History   Socioeconomic History  . Marital status: Married    Spouse name: None  . Number of children: None  . Years of education: None  . Highest education level: None  Social Needs  . Financial resource strain: None  . Food insecurity - worry: None  . Food insecurity - inability: None  .  Transportation needs - medical: None  . Transportation needs - non-medical: None  Occupational History  . None  Tobacco Use  . Smoking status: Never Smoker  . Smokeless tobacco: Never Used  Substance and Sexual Activity  . Alcohol use: Yes    Alcohol/week: 4.8 oz    Types: 8 Glasses of wine per week  . Drug use: No  . Sexual activity: None  Other Topics Concern  . None  Social History Narrative   Married 1995. 2 sons- 23 and 78 in 2017.       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 Oropharynx normal Neck:  no thyromegaly CV: RRR no murmurs rubs or gallops Lungs: CTAB no crackles, wheeze, rhonchi Abdomen: soft/nontender/nondistended/normal bowel sounds. No rebound or guarding.  Ext: no edema Skin: warm, dry Neuro: grossly normal, moves all extremities, PERRLA  Assessment/Plan:  46 y.o. female presenting for annual physical.  Health Maintenance counseling: 1. Anticipatory guidance: Patient counseled regarding regular dental exams -q6 months, eye exams - yearly for contacts, wearing seatbelts.  2. Risk factor reduction:  Advised patient of need for regular exercise and diet rich and fruits and vegetables to reduce risk of heart attack and stroke. Exercise- 5 days a week most of time. Diet-reasonably healthy diet- weight stable.  Wt Readings from Last 3 Encounters:  10/28/17 146 lb 9.6 oz (66.5 kg)  03/22/17 145 lb 6.4 oz (66 kg)  08/10/16 141 lb 9.6 oz (64.2 kg)  3. Immunizations/screenings/ancillary studies- discussed Tdap today - she will check with work. Flu shot advised and given at work Immunization History  Administered Date(s) Administered  . Hepatitis A 05/24/2016  . Influenza,inj,Quad PF,6+ Mos 10/03/2017  . Influenza-Unspecified 09/16/2013, 09/17/2014, 09/18/2016  . Td 10/22/2006  . Typhoid Inactivated 05/24/2016  4. Cervical cancer screening- 10/03/16 with 3 year follow up 5. Breast cancer screening-  breast exam through GYN and mammogram yearly due to family history. Does 3D.  6. 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 Skin cancer screening- Sees. Dr. Allyn Kenner yearly. advised regular sunscreen use. Denies worrisome, changing, or new skin lesions.  8. Birth control/STD check- vascectomy for husband  Status of chronic or acute concerns   Scheduled labs for tomorrow morning- cbc, lipid, cmp  Hyperlipidemia HLD- very mild- update lipids. Discussed potentially being more aggressive in 5-7.5% range but fortunately last  check 0.6%- update now.   GERD GERD- compliant with prilosec 40mg , doing ok with prilosec 20mg  more recently. Significant reflux when trials off. Discussed trial zantac as being off prilosec for a few days is already causing reflux. Less than once a month will get some lower chest pain while laying down lasting for a minute then go away. Could be gas related- no other symptoms with this.   IBS- doing reasonably well. Really no issues since PPI.   Migraine Migraines- prn maxalt  Usually around her cycle. usually twice a month  Future Appointments  Date Time Provider [place]  10/29/2017  9:00 AM  [facility] None   1 year CPE  Orders Placed This Encounter  Procedures  . TSH    Standing Status:   Future    Standing Expiration Date:   10/28/2018    Meds ordered this encounter  Medications  . rizatriptan (MAXALT) 10 MG tablet    Sig: TAKE 1 TABLET BY MOUTH AS NEEDED FOR MIGRAINE. MAY REPEAT IN 2 HOURS IF NEEDED    Dispense:  9 tablet    Refill:  11    Return precautions advised.  Garret Reddish, MD

## 2017-10-28 NOTE — Patient Instructions (Addendum)
Please check about your last Tdap with work. We last have records 10/22/06.   Sign release of information at the check out desk for last mammogram  Come by for fasting labs tomorrow  Glad prilosec 20mg  is helpful for you. I would stay at this lower dose or potentially trial zantac 150mg  twice a day. Might even be able to get away with once a day before dinner

## 2017-10-28 NOTE — Assessment & Plan Note (Signed)
HLD- very mild- update lipids. Discussed potentially being more aggressive in 5-7.5% range but fortunately last check 0.6%- update now.

## 2017-10-28 NOTE — Assessment & Plan Note (Addendum)
GERD- compliant with prilosec 40mg , doing ok with prilosec 20mg  more recently. Significant reflux when trials off. Discussed trial zantac as being off prilosec for a few days is already causing reflux. Less than once a month will get some lower chest pain while laying down lasting for a minute then go away. Could be gas related- no other symptoms with this.   IBS- doing reasonably well. Really no issues since PPI.

## 2017-10-29 ENCOUNTER — Encounter: Payer: Self-pay | Admitting: Family Medicine

## 2017-10-29 ENCOUNTER — Other Ambulatory Visit (INDEPENDENT_AMBULATORY_CARE_PROVIDER_SITE_OTHER): Payer: 59

## 2017-10-29 DIAGNOSIS — E785 Hyperlipidemia, unspecified: Secondary | ICD-10-CM

## 2017-10-29 DIAGNOSIS — Z0000 Encounter for general adult medical examination without abnormal findings: Secondary | ICD-10-CM | POA: Diagnosis not present

## 2017-10-29 DIAGNOSIS — Z8349 Family history of other endocrine, nutritional and metabolic diseases: Secondary | ICD-10-CM

## 2017-10-29 LAB — COMPREHENSIVE METABOLIC PANEL
ALBUMIN: 4.4 g/dL (ref 3.5–5.2)
ALK PHOS: 38 U/L — AB (ref 39–117)
ALT: 10 U/L (ref 0–35)
AST: 15 U/L (ref 0–37)
BILIRUBIN TOTAL: 1 mg/dL (ref 0.2–1.2)
BUN: 12 mg/dL (ref 6–23)
CALCIUM: 9.5 mg/dL (ref 8.4–10.5)
CO2: 31 mEq/L (ref 19–32)
Chloride: 103 mEq/L (ref 96–112)
Creatinine, Ser: 0.82 mg/dL (ref 0.40–1.20)
GFR: 79.21 mL/min (ref >60.00)
GLUCOSE: 97 mg/dL (ref 70–99)
Potassium: 5.1 mEq/L (ref 3.5–5.1)
Sodium: 139 mEq/L (ref 135–145)
TOTAL PROTEIN: 6.5 g/dL (ref 6.0–8.3)

## 2017-10-29 LAB — CBC
HCT: 43.6 % (ref 36.0–46.0)
HEMOGLOBIN: 14.6 g/dL (ref 12.0–15.0)
MCHC: 33.6 g/dL (ref 30.0–36.0)
MCV: 95.1 fl (ref 78.0–100.0)
PLATELETS: 238 10*3/uL (ref 150.0–400.0)
RBC: 4.58 Mil/uL (ref 3.87–5.11)
RDW: 12 % (ref 11.5–15.5)
WBC: 4.5 10*3/uL (ref 4.0–10.5)

## 2017-10-29 LAB — LIPID PANEL
CHOLESTEROL: 187 mg/dL (ref 0–200)
HDL: 64.3 mg/dL (ref >39.00)
LDL Cholesterol: 111 mg/dL — ABNORMAL HIGH (ref 0–99)
NONHDL: 122.84
TRIGLYCERIDES: 60 mg/dL (ref 0.0–149.0)
Total CHOL/HDL Ratio: 3
VLDL: 12 mg/dL (ref 0.0–40.0)

## 2017-10-29 LAB — TSH: TSH: 2.18 u[IU]/mL (ref 0.35–4.50)

## 2018-04-30 ENCOUNTER — Other Ambulatory Visit: Payer: Self-pay

## 2018-04-30 DIAGNOSIS — G43C0 Periodic headache syndromes in child or adult, not intractable: Secondary | ICD-10-CM

## 2018-04-30 MED ORDER — RIZATRIPTAN BENZOATE 10 MG PO TABS: ORAL_TABLET | ORAL | 11 refills | Status: DC

## 2018-09-25 DIAGNOSIS — Z23 Encounter for immunization: Secondary | ICD-10-CM | POA: Diagnosis not present

## 2018-09-26 ENCOUNTER — Encounter: Payer: Self-pay | Admitting: Family Medicine

## 2018-10-16 DIAGNOSIS — Z124 Encounter for screening for malignant neoplasm of cervix: Secondary | ICD-10-CM | POA: Diagnosis not present

## 2018-10-16 DIAGNOSIS — Z1231 Encounter for screening mammogram for malignant neoplasm of breast: Secondary | ICD-10-CM | POA: Diagnosis not present

## 2018-10-16 DIAGNOSIS — Z01419 Encounter for gynecological examination (general) (routine) without abnormal findings: Secondary | ICD-10-CM | POA: Diagnosis not present

## 2018-10-16 DIAGNOSIS — Z6822 Body mass index (BMI) 22.0-22.9, adult: Secondary | ICD-10-CM | POA: Diagnosis not present

## 2018-10-16 LAB — HM MAMMOGRAPHY

## 2018-10-17 LAB — HM PAP SMEAR: HM Pap smear: NEGATIVE

## 2018-10-20 DIAGNOSIS — I781 Nevus, non-neoplastic: Secondary | ICD-10-CM | POA: Diagnosis not present

## 2018-12-05 DIAGNOSIS — R05 Cough: Secondary | ICD-10-CM | POA: Diagnosis not present

## 2018-12-05 DIAGNOSIS — R030 Elevated blood-pressure reading, without diagnosis of hypertension: Secondary | ICD-10-CM | POA: Diagnosis not present

## 2019-03-10 ENCOUNTER — Encounter: Payer: 59 | Admitting: Family Medicine

## 2019-03-12 ENCOUNTER — Encounter: Payer: 59 | Admitting: Family Medicine

## 2019-05-16 ENCOUNTER — Other Ambulatory Visit: Payer: Self-pay | Admitting: Family Medicine

## 2019-05-16 DIAGNOSIS — G43C0 Periodic headache syndromes in child or adult, not intractable: Secondary | ICD-10-CM

## 2019-07-10 ENCOUNTER — Encounter: Payer: Self-pay | Admitting: Family Medicine

## 2019-07-10 ENCOUNTER — Other Ambulatory Visit: Payer: Self-pay

## 2019-07-10 ENCOUNTER — Ambulatory Visit (INDEPENDENT_AMBULATORY_CARE_PROVIDER_SITE_OTHER): Payer: 59 | Admitting: Family Medicine

## 2019-07-10 VITALS — BP 130/88 | HR 89 | Temp 98.1°F | Ht 66.75 in | Wt 133.4 lb

## 2019-07-10 DIAGNOSIS — R079 Chest pain, unspecified: Secondary | ICD-10-CM

## 2019-07-10 DIAGNOSIS — G43C0 Periodic headache syndromes in child or adult, not intractable: Secondary | ICD-10-CM | POA: Diagnosis not present

## 2019-07-10 DIAGNOSIS — E785 Hyperlipidemia, unspecified: Secondary | ICD-10-CM

## 2019-07-10 DIAGNOSIS — K219 Gastro-esophageal reflux disease without esophagitis: Secondary | ICD-10-CM | POA: Diagnosis not present

## 2019-07-10 DIAGNOSIS — Z0001 Encounter for general adult medical examination with abnormal findings: Secondary | ICD-10-CM

## 2019-07-10 NOTE — Progress Notes (Signed)
Phone: [phone]   Subjective:  Patient presents today for their annual physical. Chief complaint-noted.   See problem oriented charting- ROS- full  review of systems was completed and negative except for: occasional chest pain  The following were reviewed and entered/updated in epic: Past Medical History:  Diagnosis Date   Depression 1993   Post Partum. patient states more of PMS   Gilbert syndrome    noted by Dr. Linna Darner. 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    Priority: Low   Blood in stool 07/19/2010    Priority: Low   IBS 04/21/2010    Priority: Low   Hyperlipidemia 10/28/2017   Raynaud phenomenon 08/10/2016   Past Surgical History:  Procedure Laterality Date   COLONOSCOPY  2011   rectal bleeding; "runner's colitis"   DILATION AND CURETTAGE OF UTERUS     Dr Raphael Gibney   HYSTEROSCOPY  2011   with polypectomy & ablation   NOVASURE ABLATION  2011   endometrial    WISDOM TOOTH EXTRACTION      Family History  Problem Relation Age of Onset   Cancer Mother        breast cancer & NHL   Cancer Maternal Grandmother        endometrial   Hyperlipidemia Paternal Grandfather    Heart disease Paternal Grandfather        pacer   Heart disease Father 28        4 vessel CBAG   Prostate cancer Father    Hypothyroidism Father    Hyperthyroidism Sister    Prostate cancer Brother    Stroke Neg Hx    Diabetes Neg Hx    Ulcers Neg Hx     Medications- reviewed and updated Current Outpatient Medications  Medication Sig Dispense Refill   Calcium-Magnesium-Vitamin D (CALCIUM MAGNESIUM PO) Take by mouth. 2 by mouth at bedtime      Cholecalciferol (VITAMIN D3) 2000 UNITS  TABS Take by mouth daily.       ibuprofen (ADVIL,MOTRIN) 200 MG tablet Take 200 mg by mouth as needed.       loratadine (CLARITIN) 10 MG tablet Take 10 mg by mouth daily.     Multiple Vitamin (MULTIVITAMIN) tablet Take 1 tablet by mouth daily.       OMEGA 3 1000 MG CAPS Take by mouth daily.       rizatriptan (MAXALT) 10 MG tablet TAKE 1 TABLET BY MOUTH AS NEEDED FOR MIGRAINE. MAY REPEAT IN 2 HOURS IF NEEDED 9 tablet 11   No current facility-administered medications for this visit.     Allergies-reviewed and updated Allergies  Allergen Reactions   Clarithromycin     REACTION: METALLIC TASTE    Social History   Social History Narrative   Married 1995. 2 sons- 38 and 77 in 2017.       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 Oropharynx normal Neck: no thyromegaly CV: RRR no murmurs rubs or gallops Lungs: CTAB no crackles, wheeze, rhonchi Abdomen: soft/nontender/nondistended/normal bowel sounds. No rebound or guarding.  Ext: no edema and 2+ PT pulses Skin: warm, dry Neuro: grossly normal, moves all extremities, PERRLA  EKG: sinus rhythm with rate 77, normal axis, normal intervals, no hypertrophy, no st or t wave chages when compared to EKG 05/20/2012. Flipped t wave in v2 is stable from taht exam.     Assessment and Plan    48 y.o. female presenting for annual physical.  Health Maintenance counseling: 1. Anticipatory guidance: Patient counseled regarding regular dental exams -q6 months, eye exams - yearly,  avoiding smoking and second hand smoke , limiting alcohol to 1 beverage per day- slightly above this at 2 glasses of wine (advised  stick to 1)  .   2. Risk factor reduction:  Advised patient of need for regular exercise and diet rich and fruits and vegetables to reduce risk of heart attack and stroke. Exercise-5 days a week most weeks in past- now much more inconsistent. May help with stress to restart Diet-continues to eat a reasonably healthy diet- lost weight due to low appetite.  Wt Readings from Last 3 Encounters:  07/10/19 133 lb 6.4 oz (60.5 kg)  10/28/17 146 lb 9.6 oz (66.5 kg)  03/22/17 145 lb 6.4 oz (66 kg)  3. Immunizations/screenings/ancillary studies-fully up-to-date.  Recommended fall flu shot  Immunization History  Administered Date(s) Administered   Hepatitis A 05/24/2016   Influenza,inj,Quad PF,6+ Mos 10/03/2017   Influenza-Unspecified 09/16/2013, 09/17/2014, 09/18/2016, 09/25/2018   Td 10/22/2006   Tdap 06/30/2010   Typhoid Inactivated 05/24/2016  4. Cervical cancer screening- last Pap smear on file October 2017- asked for a copy of most recent Pap smear from gynecology-appears to have had October 2019 5. Breast cancer screening-  breast exam with GYN and mammogram has been completed-patient will sign a release of information to obtain from [facility] through [place] 6. Colon cancer screening - had colonoscopy 2011 due to runners colitis.  Restart age 50-next physical 35. Skin cancer screening-sees Dr. Allyn Kenner yearlyadvised regular sunscreen use. Denies worrisome, changing, or new skin lesions.  8. Birth control/STD check- monogamous.  Vasectomy for her husband 32. Osteoporosis screening at 89- we will plan on this 10.  Never smoker  Status of chronic or acute concerns   #social update- in [place]- dad with dementia- mom cant talk after glioblastoma surgery- tremor so bad cant write. She is there 5 days a week. A lot of stress. Trying to keep parents together but may not be able to   Migraine - Taking Maxalt 10 mg prn.  About twice a month around menstruation. Some  months better than others.   GERD - Taking Omeprazole 40 mg daily.  Trial of 20 mg dose in the past was not effective.  Atypical chest pain-- every few months will get left sided chest pain that is sharp lasting for 30 minutes then going away. Wonders if its gas related. Has had for the past year- happened before moms diagnosis. Happens every few months. Does not happen with exercise. Can happen when sleeping. Never happened with exertion. Suspect stress related and ekg reassuring   Hyperlipidemia  - Taking Omega 3 supplement daily.  Mild  elevations in the past-update today and continue risk over 7.5% consider statin as already does a really good job with lifestyle choices  Family Hx of Hypothyroidism -we will update TSH per patient preference.  Recommended follow up: 1 year follow up   Lab/Order associations:Not fasting- will return for fasting labs   ICD-10-CM   1. Encounter for preventative adult health care exam with abnormal findings  Z00.01 CBC    Comprehensive metabolic panel    Lipid panel    TSH    CANCELED: CBC    CANCELED: Comprehensive metabolic panel    CANCELED: Lipid panel    CANCELED: TSH  2. Hyperlipidemia, unspecified hyperlipidemia type  E78.5 CBC    Comprehensive metabolic panel    Lipid panel    TSH    CANCELED: CBC    CANCELED: Comprehensive metabolic panel    CANCELED: Lipid panel    CANCELED: TSH  3. Gastroesophageal reflux disease without esophagitis  K21.9   4. Periodic headache syndrome, not intractable  G43.C0   5. Chest pain, unspecified type  R07.9 EKG 12-Lead    No orders of the defined types were placed in this encounter.   Return precautions advised.  Garret Reddish, MD

## 2019-07-10 NOTE — Patient Instructions (Addendum)
Health Maintenance Due  Topic Date Due  . MAMMOGRAM - [facility] (F) [phone] [place]   Also would love copy of Pap smear - we requested both 05/22/2015   Schedule a lab visit at the check out desk within 2 weeks. Return for future fasting labs meaning nothing but water after midnight please. Ok to take your medications with water.

## 2019-07-15 ENCOUNTER — Other Ambulatory Visit (INDEPENDENT_AMBULATORY_CARE_PROVIDER_SITE_OTHER): Payer: 59

## 2019-07-15 ENCOUNTER — Other Ambulatory Visit: Payer: Self-pay

## 2019-07-15 DIAGNOSIS — Z0001 Encounter for general adult medical examination with abnormal findings: Secondary | ICD-10-CM

## 2019-07-15 DIAGNOSIS — E785 Hyperlipidemia, unspecified: Secondary | ICD-10-CM

## 2019-07-15 LAB — CBC
HCT: 43.8 % (ref 36.0–46.0)
Hemoglobin: 14.7 g/dL (ref 12.0–15.0)
MCHC: 33.5 g/dL (ref 30.0–36.0)
MCV: 93.7 fl (ref 78.0–100.0)
Platelets: 250 10*3/uL (ref 150.0–400.0)
RBC: 4.67 Mil/uL (ref 3.87–5.11)
RDW: 12.7 % (ref 11.5–15.5)
WBC: 5 10*3/uL (ref 4.0–10.5)

## 2019-07-15 LAB — COMPREHENSIVE METABOLIC PANEL
ALT: 7 U/L (ref 0–35)
AST: 13 U/L (ref 0–37)
Albumin: 4.5 g/dL (ref 3.5–5.2)
Alkaline Phosphatase: 41 U/L (ref 39–117)
BUN: 12 mg/dL (ref 6–23)
CO2: 29 mEq/L (ref 19–32)
Calcium: 9.3 mg/dL (ref 8.4–10.5)
Chloride: 103 mEq/L (ref 96–112)
Creatinine, Ser: 0.83 mg/dL (ref 0.40–1.20)
GFR: 72.97 mL/min (ref >60.00)
Glucose, Bld: 95 mg/dL (ref 70–99)
Potassium: 4.5 mEq/L (ref 3.5–5.1)
Sodium: 139 mEq/L (ref 135–145)
Total Bilirubin: 0.7 mg/dL (ref 0.2–1.2)
Total Protein: 6.4 g/dL (ref 6.0–8.3)

## 2019-07-15 LAB — LIPID PANEL
Cholesterol: 162 mg/dL (ref 0–200)
HDL: 72 mg/dL (ref >39.00)
LDL Cholesterol: 77 mg/dL (ref 0–99)
NonHDL: 89.75
Total CHOL/HDL Ratio: 2
Triglycerides: 62 mg/dL (ref 0.0–149.0)
VLDL: 12.4 mg/dL (ref 0.0–40.0)

## 2019-07-15 LAB — TSH: TSH: 1.46 u[IU]/mL (ref 0.35–4.50)

## 2019-09-11 ENCOUNTER — Encounter: Payer: Self-pay | Admitting: Family Medicine

## 2019-09-25 ENCOUNTER — Encounter: Payer: Self-pay | Admitting: Family Medicine

## 2019-11-24 LAB — HM MAMMOGRAPHY

## 2019-12-14 ENCOUNTER — Encounter: Payer: Self-pay | Admitting: Family Medicine

## 2020-07-11 NOTE — Patient Instructions (Addendum)
Please stop by lab before you go If you have mychart- we will send your results within 3 business days of [place] receiving them.  If you do not have mychart- we will call you about results within 5 business days of [place] receiving them.   Tdap today   Team- please log covid 19 vaccine

## 2020-07-11 NOTE — Progress Notes (Signed)
Phone [phone]   Subjective:  Patient presents today for their annual physical. Chief complaint-noted.   See problem oriented charting- Review of Systems  Constitutional: Negative for chills and fever.  HENT: Negative for congestion, ear pain and hearing loss.   Eyes: Negative for blurred vision and double vision.  Respiratory: Negative for cough and shortness of breath.   Cardiovascular: Negative for chest pain and palpitations.  Gastrointestinal: Negative for constipation, diarrhea, heartburn, nausea and vomiting.  Genitourinary: Negative for dysuria and frequency.       With menstruation harder emptying but then resolves  Musculoskeletal: Negative for back pain and myalgias.  Skin: Negative for itching and rash.  Neurological: Positive for headaches (once a month migraines). Negative for dizziness.  Endo/Heme/Allergies: Negative for polydipsia. Does not bruise/bleed easily.  Psychiatric/Behavioral: Negative for depression, hallucinations, substance abuse and suicidal ideas.   The following were reviewed and entered/updated in epic: Past Medical History:  Diagnosis Date  . 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 patient states more of PMS  . Gilbert syndrome    noted by Dr. Linna Darner. last bilirubin ok  . H/O dysmenorrhea   . H/O menorrhagia    ablation 2011  . Headache(784.0)    migraine  . 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 Family history of ovarian cancer 07/12/2020    Priority: Medium  . Hyperlipidemia 10/28/2017    Priority: Medium  . Raynaud phenomenon 08/10/2016    Priority: Medium  . GERD 01/20/2009    Priority: Medium  . Migraine 01/20/2009    Priority: Medium  . 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 Blood in stool 07/19/2010    Priority: Low  . IBS 04/21/2010    Priority: Low   Past Surgical History:  Procedure Laterality Date  . COLONOSCOPY  2011   rectal bleeding; "runner's colitis"  .  DILATION AND CURETTAGE OF UTERUS     Dr Raphael Gibney  . HYSTEROSCOPY  2011   with polypectomy & ablation  . NOVASURE ABLATION  2011   endometrial   . WISDOM TOOTH EXTRACTION      Family History  Problem Relation Age of Onset  . Cancer Mother        breast cancer & NHL  . Brain cancer Mother        glioblastoma  . Cancer Maternal Grandmother        endometrial  . Hyperlipidemia Paternal Grandfather   . Heart disease Paternal Grandfather        pacer  . Heart disease Father 90        4 vessel CBAG  . Prostate cancer Father   . Hypothyroidism Father   . Hyperthyroidism Sister   . Prostate cancer Brother   . Stroke Neg Hx   . Diabetes Neg Hx   . Ulcers Neg Hx     Medications- reviewed and updated Current Outpatient Medications  Medication Sig Dispense Refill  . Calcium-Magnesium-Vitamin D (CALCIUM MAGNESIUM PO) Take by mouth. 2 by mouth at bedtime     . Cholecalciferol (VITAMIN D3) 2000 UNITS TABS Take by mouth daily.      Marland Kitchen ibuprofen (ADVIL,MOTRIN) 200 MG tablet Take 200 mg by mouth as needed.      . loratadine (CLARITIN) 10 MG tablet Take 10 mg by mouth daily.    . Multiple Vitamin (MULTIVITAMIN) tablet Take 1 tablet by mouth daily.      Marland Kitchen  OMEGA 3 1000 MG CAPS Take by mouth daily.      . rizatriptan (MAXALT) 10 MG tablet May repeat in 2 hours if needed 9 tablet 11   No current facility-administered medications for this visit.    Allergies-reviewed and updated Allergies  Allergen Reactions  . Clarithromycin     REACTION: METALLIC TASTE    Social History   Social History Narrative   Married 1995. 2 sons- 1 and 35 in 2017.       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 Oropharynx normal Neck: no thyromegaly CV: RRR no murmurs rubs or gallops Lungs: CTAB no crackles, wheeze, rhonchi Abdomen: soft/nontender/nondistended/normal bowel sounds. No rebound or guarding.  Ext: no edema Skin: warm, dry Neuro: grossly normal, moves all extremities, PERRLA   Assessment and Plan   49 y.o. female presenting for annual physical.  Health Maintenance counseling: 1. Anticipatory guidance: Patient counseled regarding regular dental exams yes q6 months, eye exams yes yearly ,  avoiding smoking and second hand smoke yes , limiting alcohol to 1 beverage per day- 2 or 3 days without a drink and then may have 1-2- typically under 7 a week .   2. Risk factor reduction:  Advised patient of need for regular exercise and diet rich and fruits and vegetables to reduce risk of heart attack and stroke. Exercise- daily. Diet- eats at home during the week and out on the weekend. Reasonably healthy diet.  Wt Readings from Last 3 Encounters:  07/12/20 144 lb (65.3 kg)  07/10/19 133 lb 6.4 oz (60.5 kg)  10/28/17 146 lb 9.6 oz (66.5 kg)  3. Immunizations/screenings/ancillary studies-discussed Tdap today.- opts in Discussed COVID-19 vaccination and this will be logged- already had.  Discussed hepatitis C screening- donates blood to one blood  Immunization History  Administered Date(s) Administered  . Hepatitis A 05/24/2016  . Influenza,inj,Quad PF,6+ Mos 10/03/2017, 09/24/2019  . Influenza-Unspecified 09/16/2013, 09/17/2014, 09/18/2016, 09/25/2018  . Td 10/22/2006  . Tdap 06/30/2010  . Typhoid Inactivated 05/24/2016  4. Cervical cancer screening-  Last noted 10/16/18 (on 2-3 year cycle) follows with [facility] 5. Breast cancer screening-  breast exam with GYN and mammogram 11/2019-through [facility] 6. 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 Skin cancer screening-continues to follow with Dr. Shepard [facility] office- has been a while but has a fall visit. advised regular sunscreen use. Denies worrisome, changing, or new skin lesions.  8. Birth control/STD check- declines-monogamous and husband has vasectomy 9. Osteoporosis screening at 37- we will plan on this -Never smoker  Status of chronic or acute concerns   #Social update-unfortunately mother died from glioblastoma.  Father with dementia in [place] memory unit now. Goes on weekly basis to visit him- overal stress is better but obviously still hard- dad is declining.   #Migraines-Maxalt 10 mg as needed-typically around once or twice a month with menstruation or right after  #hyperlipidemia S: Medication: Very mild hyperlipidemia but not in range where  she needs to start statin Lab Results  Component Value Date   CHOL 162 07/15/2019   HDL 72.00 07/15/2019   LDLCALC 77 07/15/2019   TRIG 62.0 07/15/2019   CHOLHDL 2 07/15/2019   A/P: unlikely to start statin- update lipid panel - 10 year ascvd risk has been below 7.5% at less than 1% -Also has history in the family of hypothyroidism so we will check TSH per patient preference  # GERD S: has been able to come off omeprazole and stay off that. Was concerned about memory on the medicine.   Last year was getting some intermittent left-sided sharp chest pain for about 30 minutes-thought may be gas related.  Occurs every few months but does not occur with exercise.  EKG reassuring last year. Has not been having issues lately.  A/P: doing better lately- continue to monitor . Since off PPI and on a B12 supplement-we opted not to pursue B12 testing today  #Genetic counseling  S: Genetic Testing- fathers sister has ovarian cancer is being treated. 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 Tested negative for other mutations A/P: We will refer to genetics at this time to  consider further work-up due to paternal aunts history of ovarian cancer and chek2 mutation  Starting with some premenopausal symptoms- weight loss is harder. More pain with ovulation. Feels swollen with menstruation  Recommended follow up:  1 year physical  Lab/Order associations: fasting   ICD-10-CM   1. Preventative health care  Z00.00 CBC with Differential/Platelet    Comprehensive metabolic panel    Lipid panel    TSH  2. Hyperlipidemia, unspecified hyperlipidemia type  E78.5 CBC with Differential/Platelet    Comprehensive metabolic panel    Lipid panel    TSH  3. Gastroesophageal reflux disease without esophagitis  K21.9   4. Periodic headache syndrome, not intractable  G43.C0 rizatriptan (MAXALT) 10 MG tablet  5. Family history of genetic disease  Z84.89 Ambulatory referral to Genetics  6. Family history of ovarian cancer  Z80.41 Ambulatory referral to Genetics   Meds ordered this encounter  Medications  . rizatriptan (MAXALT) 10 MG tablet    Sig: May repeat in 2 hours if needed    Dispense:  9 tablet    Refill:  11   Return precautions advised.  Garret Reddish, MD

## 2020-07-12 ENCOUNTER — Encounter: Payer: Self-pay | Admitting: Family Medicine

## 2020-07-12 ENCOUNTER — Other Ambulatory Visit: Payer: Self-pay

## 2020-07-12 ENCOUNTER — Ambulatory Visit (INDEPENDENT_AMBULATORY_CARE_PROVIDER_SITE_OTHER): Payer: No Typology Code available for payment source | Admitting: Family Medicine

## 2020-07-12 VITALS — BP 100/80 | HR 77 | Temp 98.5°F | Ht 66.75 in | Wt 144.0 lb

## 2020-07-12 DIAGNOSIS — K219 Gastro-esophageal reflux disease without esophagitis: Secondary | ICD-10-CM

## 2020-07-12 DIAGNOSIS — E785 Hyperlipidemia, unspecified: Secondary | ICD-10-CM

## 2020-07-12 DIAGNOSIS — G43C0 Periodic headache syndromes in child or adult, not intractable: Secondary | ICD-10-CM

## 2020-07-12 DIAGNOSIS — Z8489 Family history of other specified conditions: Secondary | ICD-10-CM

## 2020-07-12 DIAGNOSIS — Z23 Encounter for immunization: Secondary | ICD-10-CM

## 2020-07-12 DIAGNOSIS — Z0000 Encounter for general adult medical examination without abnormal findings: Secondary | ICD-10-CM | POA: Diagnosis not present

## 2020-07-12 DIAGNOSIS — Z8041 Family history of malignant neoplasm of ovary: Secondary | ICD-10-CM

## 2020-07-12 MED ORDER — RIZATRIPTAN BENZOATE 10 MG PO TABS: ORAL_TABLET | ORAL | 11 refills | Status: DC

## 2020-07-12 NOTE — Addendum Note (Signed)
Addended by: Clyde Lundborg A on: 07/12/2020 11:12 AM   Modules accepted: Orders

## 2020-07-13 ENCOUNTER — Telehealth: Payer: Self-pay | Admitting: Genetic Counselor

## 2020-07-13 LAB — COMPREHENSIVE METABOLIC PANEL
AG Ratio: 2.2 (calc) (ref 1.0–2.5)
ALT: 12 U/L (ref 6–29)
AST: 17 U/L (ref 10–35)
Albumin: 4.4 g/dL (ref 3.6–5.1)
Alkaline phosphatase (APISO): 33 U/L — ABNORMAL LOW (ref 37–153)
BUN: 16 mg/dL (ref 7–25)
CO2: 29 mmol/L (ref 20–32)
Calcium: 9.3 mg/dL (ref 8.6–10.4)
Chloride: 103 mmol/L (ref 98–110)
Creat: 0.79 mg/dL (ref 0.50–1.05)
Globulin: 2 g/dL (calc) (ref 1.9–3.7)
Glucose, Bld: 94 mg/dL (ref 65–99)
Potassium: 4.6 mmol/L (ref 3.5–5.3)
Sodium: 139 mmol/L (ref 135–146)
Total Bilirubin: 0.9 mg/dL (ref 0.2–1.2)
Total Protein: 6.4 g/dL (ref 6.1–8.1)

## 2020-07-13 LAB — LIPID PANEL
Cholesterol: 228 mg/dL — ABNORMAL HIGH (ref <200)
HDL: 74 mg/dL (ref >=50)
LDL Cholesterol (Calc): 137 mg/dL (calc) — ABNORMAL HIGH
Non-HDL Cholesterol (Calc): 154 mg/dL (calc) — ABNORMAL HIGH (ref <130)
Total CHOL/HDL Ratio: 3.1 (calc) (ref <5.0)
Triglycerides: 71 mg/dL (ref <150)

## 2020-07-13 LAB — CBC WITH DIFFERENTIAL/PLATELET
Absolute Monocytes: 422 cells/uL (ref 200–950)
Basophils Absolute: 40 cells/uL (ref 0–200)
Basophils Relative: 0.6 %
Eosinophils Absolute: 127 cells/uL (ref 15–500)
Eosinophils Relative: 1.9 %
HCT: 42.7 % (ref 35.0–45.0)
Hemoglobin: 14.5 g/dL (ref 11.7–15.5)
Lymphs Abs: 1595 cells/uL (ref 850–3900)
MCH: 32.2 pg (ref 27.0–33.0)
MCHC: 34 g/dL (ref 32.0–36.0)
MCV: 94.9 fL (ref 80.0–100.0)
MPV: 10.7 fL (ref 7.5–12.5)
Monocytes Relative: 6.3 %
Neutro Abs: 4516 cells/uL (ref 1500–7800)
Neutrophils Relative %: 67.4 %
Platelets: 246 10*3/uL (ref 140–400)
RBC: 4.5 10*6/uL (ref 3.80–5.10)
RDW: 11.5 % (ref 11.0–15.0)
Total Lymphocyte: 23.8 %
WBC: 6.7 10*3/uL (ref 3.8–10.8)

## 2020-07-13 LAB — TSH: TSH: 1.56 mIU/L

## 2020-07-13 NOTE — Telephone Encounter (Signed)
Received a genetic counseling referral from Dr. Yong Channel for fhx of ovarian cancer. [name] has been cld and scheduled to see Raquel Sarna on 8/5 at 11am. Pt aware to arrive 15 minutes early.

## 2020-07-21 ENCOUNTER — Inpatient Hospital Stay: Payer: No Typology Code available for payment source

## 2020-07-21 ENCOUNTER — Inpatient Hospital Stay: Payer: No Typology Code available for payment source | Attending: Family Medicine | Admitting: Genetic Counselor

## 2020-07-21 ENCOUNTER — Other Ambulatory Visit: Payer: Self-pay

## 2020-07-21 ENCOUNTER — Encounter: Payer: Self-pay | Admitting: Genetic Counselor

## 2020-07-21 DIAGNOSIS — Z8489 Family history of other specified conditions: Secondary | ICD-10-CM | POA: Insufficient documentation

## 2020-07-21 DIAGNOSIS — Z8041 Family history of malignant neoplasm of ovary: Secondary | ICD-10-CM | POA: Insufficient documentation

## 2020-07-21 DIAGNOSIS — Z8042 Family history of malignant neoplasm of prostate: Secondary | ICD-10-CM | POA: Diagnosis not present

## 2020-07-22 ENCOUNTER — Encounter: Payer: Self-pay | Admitting: Genetic Counselor

## 2020-07-22 DIAGNOSIS — Z8042 Family history of malignant neoplasm of prostate: Secondary | ICD-10-CM

## 2020-07-22 DIAGNOSIS — Z8489 Family history of other specified conditions: Secondary | ICD-10-CM | POA: Insufficient documentation

## 2020-07-22 HISTORY — DX: Family history of malignant neoplasm of prostate: Z80.42

## 2020-07-22 HISTORY — DX: Family history of other specified conditions: Z84.89

## 2020-07-22 NOTE — Progress Notes (Signed)
EFERRING PROVIDER: Marin Olp, [place],  [place] [address]  PRIMARY PROVIDER:  Marin Olp, MD  PRIMARY REASON FOR VISIT:  1. Family history of ovarian cancer   2. Family history of genetic disorder   3. Family history of prostate cancer     HISTORY OF PRESENT ILLNESS:   [name], a 49 y.o. female, was seen for a [facility] cancer genetics consultation at the request of Dr. Yong Channel due to a family history of ovarian cancer a familial low penetrance CHEK2 mutation.  Erica Gordon presents to clinic today to discuss the possibility of a hereditary predisposition to cancer, to discuss genetic testing, and to further clarify her future cancer risks, as well as potential cancer risks for family members.   Erica Gordon is a 49 y.o. female with no personal history of cancer.    RISK FACTORS:  Menarche was at age 9.  First live birth at age 82.  OCP use for approximately 13 years.  Ovaries intact: yes.  Hysterectomy: no.  Menopausal status: premenopausal.  HRT use: 0 years. Colonoscopy: yes; most recent in 2011; plans to schedule colonoscopy next month. Mammogram within the last year: yes. Number of breast biopsies: 0. Up to date with pelvic exams: yes. Any excessive radiation exposure in the past: no  Past Medical History:  Diagnosis Date  . 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 patient states more of PMS  . Family history of genetic disorder 07/22/2020  . Family history of malignant neoplasm of ovary   . Family history of prostate cancer 07/22/2020  . Gilbert syndrome    noted by Dr. Linna Darner. last bilirubin ok  . H/O dysmenorrhea   . H/O menorrhagia    ablation 2011  . Headache(784.0)    migraine  . 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 COLONOSCOPY  2011   rectal bleeding; "runner's colitis"  . DILATION AND CURETTAGE OF UTERUS     Dr Raphael Gibney  . HYSTEROSCOPY  2011   with polypectomy & ablation  . NOVASURE  ABLATION  2011   endometrial   . WISDOM TOOTH EXTRACTION      Social History   Socioeconomic History  . Marital status: Married    Spouse name: Not on file  . Number of children: Not on file  . Years of education: Not on file  . Highest education level: Not on file  Occupational History  . Not on file  Tobacco Use  . Smoking status: Never Smoker  . Smokeless tobacco: Never Used  Substance and Sexual Activity  . Alcohol use: Yes    Alcohol/week: 8.0 standard drinks    Types: 8 Glasses of wine per week  . Drug use: No  . Sexual activity: Not on file  Other Topics Concern  . Not on file  Social History Narrative   Married 1995. 2 sons- 22 and 82 in 2017.       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 Difficulty of Paying Living Expenses:   Food Insecurity:   . Worried About Charity fundraiser in the Last Year:   . Arboriculturist in the Last Year:   Transportation Needs:   . Lack of Transportation (  Medical):   Marland Kitchen Lack of Transportation (Non-Medical):   Physical Activity:   . Days of Exercise per Week:   . Minutes of Exercise per Session:   Stress:   . Feeling of Stress :   Social Connections:   . Frequency of Communication with Friends and Family:   . Frequency of Social Gatherings with Friends and Family:   . Attends Religious Services:   . Active Member of Clubs or Organizations:   . Attends Archivist Meetings:   Marland Kitchen Marital Status:      FAMILY HISTORY:  We obtained a detailed, 4-generation family history.  Significant diagnoses are listed below: Family History  Problem Relation Age of Onset  . Breast cancer Mother 11  . Non-Hodgkin's lymphoma Mother 52  . Brain cancer Mother        glioblastoma; dx early 38s  . Uterine cancer Maternal Grandmother        dx mid 51s  .  Hyperlipidemia Paternal Grandfather   . Heart disease Paternal Grandfather        pacer  . Heart disease Father 40        4 vessel CBAG  . Prostate cancer Father 81  . Hypothyroidism Father   . Hyperthyroidism Sister   . Cervical cancer Sister        dx mid 60s  . Prostate cancer Brother 7  . Ovarian cancer Paternal Aunt 79  . Other Paternal Aunt        low penetrance CHEK2 mutation (I157T)  . Other Paternal Aunt 3       brain tumor  . Stroke Neg Hx   . Diabetes Neg Hx   . Ulcers Neg Hx        Erica Gordon has two sons.  She has one brother, who was diagnosed with prostate cancer at 22, and one sister, who was diagnosed with cervical cancer in her mid 72s.  Erica Gordon mother was diagnosed with Non Hodgkin's Lymphoma at 73, breast cancer at 29, and glioblastoma in her early 25s.  Erica Gordon recalls that her mother had genetic testing in early 2000s and was reported negative.  Erica Gordon grandmother (d. 53) and maternal great aunt (d. 35s) were both diagnosed with uterine cancer.  Erica Gordon father, now age 62, was diagnosed with prostate cancer at age 47.  He also has dementia/Alzheimer's disease.  Erica Gordon paternal aunt passed away due to a brain tumor at 50 years old.  Erica Gordon paternal aunt, age 32, was diagnosed with ovarian cancer at 50 years old.  She received genetic testing for The Multi-Cancer Panel offered by [facility] includes sequencing and/or deletion duplication testing of the following 84 genes: AIP, ALK, APC, ATM, AXIN2,BAP1,  BARD1, BLM, BMPR1A, BRCA1, BRCA2, BRIP1, CASR, CDC73, CDH1, CDK4, CDKN1B, CDKN1C, CDKN2A (p14ARF), CDKN2A (p16INK4a), CEBPA, CHEK2, CTNNA1, DICER1, DIS3L2, EGFR (c.2369C>T, p.Thr790Met variant only), EPCAM (Deletion/duplication testing only), FH, FLCN, GATA2, GPC3, GREM1 (Promoter region deletion/duplication testing only), HOXB13 (c.251G>A, p.Gly84Glu), HRAS, KIT, MAX, MEN1, MET, MITF (c.952G>A, p.Glu318Lys variant only), MLH1, MSH2, MSH3, MSH6, MUTYH,  NBN, NF1, NF2, NTHL1, PALB2, PDGFRA, PHOX2B, PMS2, POLD1, POLE, POT1, PRKAR1A, PTCH1, PTEN, RAD50, RAD51C, RAD51D, RB1, RECQL4, RET, RUNX1, SDHAF2, SDHA (sequence changes only), SDHB, SDHC, SDHD, SMAD4, SMARCA4, SMARCB1, SMARCE1, STK11, SUFU, TERC, TERT, TMEM127, TP53, TSC1, TSC2, VHL, WRN and WT1. The report date was March 2021.  Ms. Zelaya aunt was found to have a low penetrance pathogenic variant in CHEK2 c.470T>C (p.I157T).  She was  negative for all other genes tested.  No other family history of cancer was reported.   Ms. Bonneau is unaware of previous family history of genetic testing for hereditary cancer risks other than those stated above. Patient's maternal ancestors are of [place], [place], and [place] descent, and paternal ancestors are of [place] and [place] descent. There is no reported Ashkenazi Jewish ancestry. There is no known consanguinity.  GENETIC COUNSELING ASSESSMENT: Ms. Frary is a 49 y.o. female with a family history of ovarian cancer and prostate cancer which is suggestive of a hereditary predisposition to cancer given the related cancer diagnoses in multiple family members and the familial CHEK2 variant. We, therefore, discussed and recommended the following at today's visit.   DISCUSSION: We discussed that 5 - 10% of cancer is hereditary, with many cases of hereditary ovarian cancer associated with mutations in BRCA1 and BRCA2.  There are other genes that can be associated with hereditary cancer syndromes, including but not limited to CHEK2.  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  CHEK2 mutations have been found to be associated with an increased risk of breast and other cancers.  One study of the mutation found in this patient's family (c.470T>C) estimated the lifetime risk for breast cancer among carriers to be increased by about 50% over the general population risk (compared to  double the risk for the moderate penetrance CHEK2 deletions). The risk for contralateral breast cancer has not been well studied in women with the c.470T>C mutation.  CHEK2 mutations are associated with other malignancies as well. Thyroid, bladder, kidney, prostate, colon, and female breast cancer occur more frequently in individuals who carry CHEK2 mutations than in the general population. Ovarian and uterine cancers have been reported in [place] families but it is currently unknown whether there is an associated increased ovarian or uterine cancer risk.  We discussed that testing is beneficial for several reasons, including knowing about other cancer risks, identifying potential screening and risk-reduction options that may be appropriate, and to understand if other family members could be at risk for cancer and allow them to undergo genetic testing.  We reviewed the characteristics, features and inheritance patterns of hereditary cancer syndromes. We also discussed genetic testing, including the appropriate family members to test, the process of testing, insurance coverage and turn-around-time for results. We discussed the implications of a negative, positive, carrier and/or variant of uncertain significant result. We recommended Ms. Roscher pursue genetic testing for a panel that contains genes associated with her family history.  Ms. Durio was offered a common hereditary cancer panel (48 genes) and an expanded pan-cancer panel (85 genes). Ms. Bray was informed of the benefits and limitations of each panel, including that expanded pan-cancer panels contain several preliminary evidence genes that do not have clear management guidelines at this point in time.  We also discussed that as the number of genes included on a panel increases, the chances of variants of uncertain significance increases.  After considering the benefits and limitations of each gene panel, Ms. Ritsema elected to have an expanded pan-cancer  through [facility], which includes analysis of the familial CHEK2 variant.    The Multi-Cancer Panel offered by [facility] includes sequencing and/or deletion duplication testing of the following 85 genes: AIP, ALK, APC, ATM, AXIN2,BAP1,  BARD1, BLM, BMPR1A, BRCA1, BRCA2, BRIP1, CASR, CDC73, CDH1, CDK4, CDKN1B, CDKN1C, CDKN2A (p14ARF), CDKN2A (p16INK4a), CEBPA, CHEK2, CTNNA1, DICER1, DIS3L2, EGFR (c.2369C>T, p.Thr790Met variant only), EPCAM (Deletion/duplication  testing only), FH, FLCN, GATA2, GPC3, GREM1 (Promoter region deletion/duplication testing only), HOXB13 (c.251G>A, p.Gly84Glu), HRAS, KIT, MAX, MEN1, MET, MITF (c.952G>A, p.Glu318Lys variant only), MLH1, MSH2, MSH3, MSH6, MUTYH, NBN, NF1, NF2, NTHL1, PALB2, PDGFRA, PHOX2B, PMS2, POLD1, POLE, POT1, PRKAR1A, PTCH1, PTEN, RAD50, RAD51C, RAD51D, RB1, RECQL4, RET, RNF43, RUNX1, SDHAF2, SDHA (sequence changes only), SDHB, SDHC, SDHD, SMAD4, SMARCA4, SMARCB1, SMARCE1, STK11, SUFU, TERC, TERT, TMEM127, TP53, TSC1, TSC2, VHL, WRN and WT1.   Based on Ms. Lamountain's family history of cancer and familial CHEK2 variant, she meets medical criteria for genetic testing. Despite that she meets criteria, she may still have an out of pocket cost. We discussed that if her out of pocket cost for testing is over $100, the laboratory will call and confirm whether she wants to proceed with testing.  If the out of pocket cost of testing is less than $100 she will be billed by the genetic testing laboratory.   We discussed that some people do not want to undergo genetic testing due to fear of genetic discrimination.  A federal law called the Genetic Information Non-Discrimination Act (GINA) of 2008 helps protect individuals against genetic discrimination based on their genetic test results.  It impacts both health insurance and employment.  With health insurance, it protects against increased premiums, being kicked off insurance or being forced to take a test in order to be insured.   For employment it protects against hiring, firing and promoting decisions based on genetic test results.  GINA does not apply to those in the [employer], those who work for companies with less than 15 employees, and new life insurance or long-term disability insurance policies.  Health status due to a cancer diagnosis is not protected under GINA.  PLAN: After considering the risks, benefits, and limitations, Ms. Mcbreen provided informed consent to pursue genetic testing and the blood sample was sent to [facility] for analysis of the Multi-Cancer Panel. Results should be available within approximately 3 weeks' time, at which point they will be disclosed by telephone to Ms. Bodner, as will any additional recommendations warranted by these results. Ms. Weatherholtz will receive a summary of her genetic counseling visit and a copy of her results once available. This information will also be available in Epic.   Ms. Polhemus was informed that others in her family qualify for genetic testing based on her aunt's genetic testing results, including Ms. Mkrtchyan's paternal first cousins, father, brother, and sister.  Ms. Grenier will inform us if we can be any assistance in locating a genetic counselor to facilitate counseling and/or testing.   Lastly, we encouraged Ms. Kaupp to remain in contact with cancer genetics annually so that we can continuously update the family history and inform her of any changes in cancer genetics and testing that may be of benefit for this family.   Ms. Tayag questions were answered to her satisfaction today. Our contact information was provided should additional questions or concerns arise. Thank you for the referral and allowing [place] to share in the care of your patient.   Payam Gribble M. Joette Catching, [email or web address] (P) [phone]   The patient was seen for a total of 40 minutes in face-to-face genetic counseling.  This patient was discussed with Drs. Magrinat,  Lindi Adie and/or Burr Medico who agrees with the above.    _______________________________________________________________________ For Office Staff:  Number of people involved in session: 1 Was an Intern/ student involved with case: no

## 2020-08-08 ENCOUNTER — Encounter: Payer: Self-pay | Admitting: Genetic Counselor

## 2020-08-08 ENCOUNTER — Ambulatory Visit: Payer: Self-pay | Admitting: Genetic Counselor

## 2020-08-08 ENCOUNTER — Telehealth: Payer: Self-pay | Admitting: Genetic Counselor

## 2020-08-08 DIAGNOSIS — Z8042 Family history of malignant neoplasm of prostate: Secondary | ICD-10-CM

## 2020-08-08 DIAGNOSIS — Z8489 Family history of other specified conditions: Secondary | ICD-10-CM

## 2020-08-08 DIAGNOSIS — Z1502 Genetic susceptibility to malignant neoplasm of ovary: Secondary | ICD-10-CM | POA: Insufficient documentation

## 2020-08-08 DIAGNOSIS — Z1379 Encounter for other screening for genetic and chromosomal anomalies: Secondary | ICD-10-CM | POA: Insufficient documentation

## 2020-08-08 DIAGNOSIS — Z1589 Genetic susceptibility to other disease: Secondary | ICD-10-CM

## 2020-08-08 DIAGNOSIS — Z8041 Family history of malignant neoplasm of ovary: Secondary | ICD-10-CM

## 2020-08-08 NOTE — Telephone Encounter (Signed)
Revealed results of genetic testing of [facility] Multi-Cancer Panel (85 gene).  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  We discussed cancer risks, management, and familial implications of this result.  We discussed that [name] should receive colonoscopies at least every 5 years.  Based on Tyrer-Cuzick risk model, [name] lifetime risk for breast cancer is over 20%.  Therefore, she should consider receiving breast MRIs in addition to mammograms every year.  We discussed that each of her sons have a 50% chance of having the familial CHEK2 variant. A variant of uncertain significance was detected in MSH3 c.16C>T (p.Pro6Ser).

## 2020-08-08 NOTE — Progress Notes (Signed)
GENETIC TEST RESULTS  Patient Name: Erica Gordon Patient Age: 49 y.o. Encounter Date: 08/08/2020  Referring Provider: Brayton Mars. 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 Please refer to the prior Genetics clinic note for more information regarding Erica Gordon's medical and family histories and our assessment at the time.   FAMILY HISTORY:  We obtained a detailed, 4-generation family history.  Significant diagnoses are listed below: Family History  Problem Relation Age of Onset  . Breast cancer Mother 3  . Non-Hodgkin's lymphoma Mother 23  . Brain cancer Mother        glioblastoma; dx early 49s  . Uterine cancer Maternal Grandmother        dx mid 15s  . Hyperlipidemia Paternal Grandfather   . Heart disease Paternal Grandfather        pacer  . Heart disease Father 71        4 vessel CBAG  . Prostate cancer Father 75  . Hypothyroidism Father   . Hyperthyroidism Sister   . Cervical cancer Sister        dx mid 3s  . Prostate cancer Brother 7  . Ovarian cancer Paternal Aunt 79  . Other Paternal Aunt        low penetrance CHEK2 mutation (I157T)  . Other Paternal Aunt 3       brain tumor  . Stroke Neg Hx   . Diabetes Neg Hx   . Ulcers Neg Hx        Erica Gordon has two sons.  She has one brother, who was diagnosed with prostate cancer at 60, and one sister, who was diagnosed with cervical cancer in her mid 4s.  Erica Gordon mother was diagnosed with Non Hodgkin's Lymphoma at 18, breast cancer at 71, and glioblastoma in her early 38s.  Erica Gordon recalls that her mother had genetic testing in early 2000s and was reported negative.  Erica Gordon grandmother (d. 24) and maternal great aunt (d. 1s) were both diagnosed with uterine cancer.  Erica Gordon  father, now age 68, was diagnosed with prostate cancer at age 34.  He also has dementia/Alzheimer's disease.  Erica Gordon paternal aunt passed away due to a brain tumor at 50 years old.  Erica Gordon paternal aunt, age 33, was diagnosed with ovarian cancer at 50 years old.  She received genetic testing for The Multi-Cancer Panel offered by [facility] includes sequencing and/or deletion duplication testing of the following 84 genes: AIP, ALK, APC, ATM, AXIN2,BAP1,  BARD1, BLM, BMPR1A, BRCA1, BRCA2, BRIP1, CASR, CDC73, CDH1, CDK4, CDKN1B, CDKN1C, CDKN2A (p14ARF), CDKN2A (p16INK4a), CEBPA, CHEK2, CTNNA1, DICER1, DIS3L2, EGFR (c.2369C>T, p.Thr790Met variant only), EPCAM (Deletion/duplication testing only), FH, FLCN, GATA2, GPC3, GREM1 (Promoter region deletion/duplication testing only), HOXB13 (c.251G>A, p.Gly84Glu), HRAS, KIT, MAX, MEN1, MET, MITF (c.952G>A, p.Glu318Lys variant only), MLH1, MSH2, MSH3, MSH6, MUTYH, NBN, NF1, NF2, NTHL1, PALB2, PDGFRA, PHOX2B, PMS2, POLD1, POLE, POT1, PRKAR1A, PTCH1, PTEN, RAD50, RAD51C, RAD51D, RB1, RECQL4, RET, RUNX1, SDHAF2, SDHA (sequence changes only), SDHB, SDHC, SDHD, SMAD4, SMARCA4, SMARCB1, SMARCE1, STK11, SUFU, TERC, TERT, TMEM127, TP53, TSC1, TSC2, VHL, WRN and WT1. The report date was March 2021.  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  She was negative for all other  genes tested.  No other family history of cancer was reported.   Erica Gordon is unaware of previous family history of genetic testing for hereditary cancer risks other than those stated above. Patient's maternal ancestors are of [place], [place], and [place] descent, and paternal ancestors are of [place] and [place] descent. There is no reported Ashkenazi Jewish ancestry. There is no known consanguinity.  GENETIC TESTING:  At the time of Erica Gordon's visit, we recommended she pursue genetic testing of the [facility] Multi-Cancer Panel test. 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  This is the familial variant detected in her paternal aunt. There were no deleterious mutations in AIP, ALK, APC, ATM, AXIN2,BAP1,  BARD1, BLM, BMPR1A, BRCA1, BRCA2, BRIP1, CASR, CDC73, CDH1, CDK4, CDKN1B, CDKN1C, CDKN2A (p14ARF), CDKN2A (p16INK4a), CEBPA, CTNNA1, DICER1, DIS3L2, EGFR (c.2369C>T, p.Thr790Met variant only), EPCAM (Deletion/duplication testing only), FH, FLCN, GATA2, GPC3, GREM1 (Promoter region deletion/duplication testing only), HOXB13 (c.251G>A, p.Gly84Glu), HRAS, KIT, MAX, MEN1, MET, MITF (c.952G>A, p.Glu318Lys variant only), MLH1, MSH2, MSH3, MSH6, MUTYH, NBN, NF1, NF2, NTHL1, PALB2, PDGFRA, PHOX2B, PMS2, POLD1, POLE, POT1, PRKAR1A, PTCH1, PTEN, RAD50, RAD51C, RAD51D, RB1, RECQL4, RET, RNF43, RUNX1, SDHAF2, SDHA (sequence changes only), SDHB, SDHC, SDHD, SMAD4, SMARCA4, SMARCB1, SMARCE1, STK11, SUFU, TERC, TERT, TMEM127, TP53, TSC1, TSC2, VHL, WRN and WT1.   Genetic testing did identify a variant of uncertain significance (VUS) was identified in the MSH3 gene called c.16C>T (p.Pro6Ser).  At this time, it is unknown if this variant is associated with increased cancer risk or if this is a normal finding, but most variants such as this get reclassified to being inconsequential. It should not be used to make medical management decisions. With time, we suspect the lab will determine the significance of this variant, if any. If we do learn more about it, we will try to contact [name] to discuss it further. However, it is important to stay in touch with [place] periodically and keep the address and phone number up to date.  DISCUSSION: The lifetime risk for breast cancer in carriers of the most common CHEK2 mutation (c.1100delC) is 25-37% (compared to 56% to 68% for BRCA). There is a tendency for these breast cancers to occur at younger  ages, and women with mutations have an increased risk for cancer in the opposite breast (about 1% per year or 24.1% after 10 years). One study of the low penetrance mutation found in the patient (c.470T>C) in [place] estimated the lifetime risk for breast cancer among carriers to be increased by about 50% over the general population risk (compared to double the risk for the common mutation). The risk for contralateral breast cancer has not been well studied in women with the c.470T>C mutation.  CHEK2 mutations are associated with other malignancies as well. Thyroid, bladder, kidney, prostate, colon, and female breast cancer occur more frequently in individuals who carry CHEK2 mutations than in the general population. As with female breast cancer, the rates vary from family to family and more research needs to be done to figure out specific risks. Current estimates include:   Thyroid cancer - lifetime risk of 5% (1.5% population risk)   Prostate cancer - lifetime risk of 30% (15% population risk)   Colon cancer - lifetime risk of 10% (5% population risk)   Female breast cancer - lifetime risk of 1% (0.1% population risk)  Ovarian and uterine cancers have been reported in CHEK2 families  but it is currently unknown whether there is an associated increased ovarian or uterine cancer risk.  SCREENING AND RISK REDUCTION:  Below are the NCCN Practice Guidelines for women.    Colon Cancer Management:  Men and women with a CHEK2 mutations may have up to a 10% lifetime risk for colon cancer. The following is recommended for individuals with a CHEK2 mutation:   Personal history of colon cancer: Follow instructions provided by physician based on your personal history.   Do not have a personal history of colon cancer but have a parent/sibling/child with colon cancer: Colonoscopy every 5 years starting at age 23 or 35 years younger than the earliest age of onset, whichever is younger.   Do not have a  personal history of colon cancer and do not have a parent/sibling/child with colon cancer: Colonoscopy every 5 years starting at age 21.   Breast Management Options We reviewed the NCCN practice guidelines (v1.2022) for breast management for women at an increased risk of breast cancer because of CHEK2 mutations:   . Starting at age 74, annual mammogram and consideration for breast MRI screening . Evidence of risk-reducing mastectomy is insufficient at this time.  Manage based on family history.  o Note that these breast cancer risk management guidelines are based on frameshift pathogenic/likely pathogenic variants.  Missense variants, such as CHEK2 I157T, are likely to confer a lower risk for breast cancer compared to framehshift variants. Management should be based on best estimates of cancer risk for the specific pathogenic/likely pathogenic variant.   Based on Ms. Lainez's family of cancer, as well as her genetic test results, statistical models Tyrer-Cuzick  and literature data were used to estimate her risk of developing breast cancer. These estimate her lifetime risk of developing breast cancer to be approximately 23%.  The patient's lifetime breast cancer risk is a preliminary estimate based on available information using one of several models endorsed by the [place] (ACS). The ACS recommends consideration of breast MRI screening as an adjunct to mammography for patients at high risk (defined as 20% or greater lifetime risk).   The program assumes that there is a gene predisposing to breast cancer in addition to the BRCA1/2 genes. The woman's family history is used to calculate the likelihood of her carrying an adverse gene, which in turn affects her likelihood of developing breast cancer. The risks of developing breast cancer for the general population were taken from data on the first breast cancer diagnosis (ICD-10 code C50) in [place] area ([place]) between 2005-2009.  The risk from family history (caused by the adverse genes) is modelled to fit the results in "Familial Breast and Ovarian Cancer: A [employer], [place]., American Journal of Epidemiology 2000, [phone]". The risk from other classical factors including age at first child and benign disease are combined with familial risk.  The latest version of the model (v8) incorporates mammographic density.     FAMILY MEMBERS: It is important that all of Ms. Reichenberger's relatives (both men and women) know of the presence of this gene mutation.  Women need to know that they may be at increased risk for breast and colon cancers.  Men are at slightly increased risk for breast, prostate and colon cancers.  Genetic testing can sort out who in the family is at risk. We would be happy to help meet with and coordinate genetic testing for any relative that is interested.  Ms. Wee children and  siblings are at 50% risk to have inherited the mutation found in her. We recommend they have genetic testing for this same mutation, as identifying the presence of this mutation would allow them to also take advantage of risk-reducing measures.   Our knowledge of cancer risks related to CHEK2 mutations will continue to evolve. We recommended that Ms. Borgmeyer follow up with the genetics clinic annually so we can provide her with the most current information about CHEK2 and cancer risk, as well as with any changes to her family history (new cancer diagnoses, genetic test results).    Our contact number was provided. Ms. Berman questions were answered to her satisfaction, and she knows she is welcome to call us at anytime with additional questions or concerns.   Carsen Machi M. Joette Catching, [email or web address] (P) [phone]

## 2020-09-22 ENCOUNTER — Encounter: Payer: Self-pay | Admitting: Family Medicine

## 2020-09-22 ENCOUNTER — Telehealth (INDEPENDENT_AMBULATORY_CARE_PROVIDER_SITE_OTHER): Payer: No Typology Code available for payment source | Admitting: Family Medicine

## 2020-09-22 VITALS — HR 85 | Temp 98.3°F | Ht 66.75 in | Wt 144.0 lb

## 2020-09-22 DIAGNOSIS — R059 Cough, unspecified: Secondary | ICD-10-CM

## 2020-09-22 DIAGNOSIS — Z8719 Personal history of other diseases of the digestive system: Secondary | ICD-10-CM

## 2020-09-22 MED ORDER — BENZONATATE 100 MG PO CAPS: 100.0000 mg | ORAL_CAPSULE | Freq: Three times a day (TID) | ORAL | 0 refills | Status: DC | PRN

## 2020-09-22 MED ORDER — PREDNISONE 20 MG PO TABS: 40.0000 mg | ORAL_TABLET | Freq: Every day | ORAL | 0 refills | Status: DC

## 2020-09-22 NOTE — Progress Notes (Signed)
Virtual Visit via Video Note  I connected with Erica Gordon  on 09/22/20 at 12:00 PM EDT by a video enabled telemedicine application and verified that I am speaking with the correct person using two identifiers.  Location patient: home, [place] Location provider:work or home office Persons participating in the virtual visit: patient, provider  I discussed the limitations of evaluation and management by telemedicine and the availability of in person appointments. The patient expressed understanding and agreed to proceed.   HPI:  Acute telemedicine visit for Cough: -Onset: 3 weeks ago -Symptoms include: started with nasal congestion and a sore throat, now with a persistent cough, tickle in the throat  -had a neg covid19 test -reports gets this every year at about the same time -Reports this usually requires treatment with steroids and a cough medication -Denies:SOB, fevers, CP, malaise, wheezing, sneezing -Has tried:claritin sporadically, delsum -Pertinent past medical history: -Pertinent medication allergies: GERD -reports she used to be on chronic medications for this, as it caused a chronic cough.  However she had side effects (memory issues )with the medications and had to stop them.   ROS: See pertinent positives and negatives per HPI.  Past Medical History:  Diagnosis Date  . 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 patient states more of PMS  . Family history of genetic disorder 07/22/2020  . Family history of malignant neoplasm of ovary   . Family history of prostate cancer 07/22/2020  . Gilbert syndrome    noted by Dr. Linna Darner. last bilirubin ok  . H/O dysmenorrhea   . H/O menorrhagia    ablation 2011  . Headache(784.0)    migraine  . 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 COLONOSCOPY  2011   rectal bleeding; "runner's colitis"  . DILATION AND CURETTAGE OF UTERUS     Dr Raphael Gibney  . HYSTEROSCOPY  2011   with polypectomy & ablation  .  NOVASURE ABLATION  2011   endometrial   . WISDOM TOOTH EXTRACTION       Current Outpatient Medications:  .  Calcium-Magnesium-Vitamin D (CALCIUM MAGNESIUM PO), Take by mouth. 2 by mouth at bedtime , Disp: , Rfl:  .  Cholecalciferol (VITAMIN D3) 2000 UNITS TABS, Take by mouth daily.  , Disp: , Rfl:  .  ibuprofen (ADVIL,MOTRIN) 200 MG tablet, Take 200 mg by mouth as needed.  , Disp: , Rfl:  .  loratadine (CLARITIN) 10 MG tablet, Take 10 mg by mouth daily., Disp: , Rfl:  .  Multiple Vitamin (MULTIVITAMIN) tablet, Take 1 tablet by mouth daily.  , Disp: , Rfl:  .  OMEGA 3 1000 MG CAPS, Take by mouth daily.  , Disp: , Rfl:  .  rizatriptan (MAXALT) 10 MG tablet, May repeat in 2 hours if needed, Disp: 9 tablet, Rfl: 11 .  benzonatate (TESSALON PERLES) 100 MG capsule, Take 1 capsule (100 mg total) by mouth 3 (three) times daily as needed., Disp: 20 capsule, Rfl: 0 .  predniSONE (DELTASONE) 20 MG tablet, Take 2 tablets (40 mg total) by mouth daily with breakfast., Disp: 8 tablet, Rfl: 0  EXAM:  VITALS per patient if applicable:  GENERAL: alert, oriented, appears well and in no acute distress  HEENT: atraumatic, conjunttiva clear, no obvious abnormalities on inspection of external nose and ears  NECK: normal movements of the head and neck  LUNGS: on inspection no signs of respiratory distress, breathing rate appears normal, no obvious gross SOB,  gasping or wheezing  CV: no obvious cyanosis  MS: moves all visible extremities without noticeable abnormality  PSYCH/NEURO: pleasant and cooperative, no obvious depression or anxiety, speech and thought processing grossly intact  ASSESSMENT AND PLAN:  Discussed the following assessment and plan:  Cough  History of gastroesophageal reflux (GERD)  -we discussed possible serious and likely etiologies, options for evaluation and workup, limitations of telemedicine visit vs in person visit, treatment, treatment risks and precautions. Pt prefers to  treat via telemedicine empirically rather than in person at this moment.  Discussed causes of a cough.  She denies any fevers or thick mucus or malaise that would suggest a bacterial infection.  Suspect possible post viral cough, allergies with postnasal drip.  Acid reflux could also be a contributing factor.  Versus other.  She had a negative COVID-19 test.  She opted to try prednisone 40 mg daily for 4 days and Tessalon.  We also talked about options for acid reflux.  She prefers to avoid medications if possible.  Lifestyle changes discussed and provided in patient instructions.  Scheduled follow up with PCP offered: Agrees to schedule follow-up if needed.  Advised to seek prompt follow up telemedicine visit or in person care if worsening, new symptoms arise, or if is not improving with treatment. Did let this patient know that I only do telemedicine on Tuesdays and Thursdays for [place]. Advised to schedule follow up visit with PCP or [facility] if any further questions or concerns to avoid delays in care.   I discussed the assessment and treatment plan with the patient. The patient was provided an opportunity to ask questions and all were answered. The patient agreed with the plan and demonstrated an understanding of the instructions.     Erica Kern, DO

## 2020-09-22 NOTE — Patient Instructions (Addendum)
-I sent the medication(s) we discussed to your pharmacy: Meds ordered this encounter  Medications  . predniSONE (DELTASONE) 20 MG tablet    Sig: Take 2 tablets (40 mg total) by mouth daily with breakfast.    Dispense:  8 tablet    Refill:  0  . benzonatate (TESSALON PERLES) 100 MG capsule    Sig: Take 1 capsule (100 mg total) by mouth 3 (three) times daily as needed.    Dispense:  20 capsule    Refill:  0    Consider doing the claritin every day for a few weeks.  Work on dietary changes for reflux and consider a short 1-2 week course of an acid reducer if things ar not improving.  Please follow up with Dr. Yong Channel if you are not feeling better over the next 3-5 days with the treatment.  Seek care promptly if your symptoms worsen, new concerns arise or you are not improving with treatment.  Please note: I work for [employer] on Tuesdays and Thursdays.  If you have any questions or concerns following this visit, please schedule a follow-up visit with your primary care office or seek in person care.    Food Choices for Gastroesophageal Reflux Disease, Adult When you have gastroesophageal reflux disease (GERD), the foods you eat and your eating habits are very important. Choosing the right foods can help ease your discomfort. Think about working with a nutrition specialist (dietitian) to help you make good choices. What are tips for following this plan?  Meals  Choose healthy foods that are low in fat, such as fruits, vegetables, whole grains, low-fat dairy products, and lean meat, fish, and poultry.  Eat small meals often instead of 3 large meals a day. Eat your meals slowly, and in a place where you are relaxed. Avoid bending over or lying down until 2-3 hours after eating.  Avoid eating meals 2-3 hours before bed.  Avoid drinking a lot of liquid with meals.  Cook foods using methods other than frying. Bake, grill, or broil food instead.  Avoid or limit: ? Chocolate. ? Peppermint  or spearmint. ? Alcohol. ? Pepper. ? Black and decaffeinated coffee. ? Black and decaffeinated tea. ? Bubbly (carbonated) soft drinks. ? Caffeinated energy drinks and soft drinks.  Limit high-fat foods such as: ? Fatty meat or fried foods. ? Whole milk, cream, butter, or ice cream. ? Nuts and nut butters. ? Pastries, donuts, and sweets made with butter or shortening.  Avoid foods that cause symptoms. These foods may be different for everyone. Common foods that cause symptoms include: ? Tomatoes. ? Oranges, lemons, and limes. ? Peppers. ? Spicy food. ? Onions and garlic. ? Vinegar. Lifestyle  Maintain a healthy weight. Ask your doctor what weight is healthy for you. If you need to lose weight, work with your doctor to do so safely.  Exercise for at least 30 minutes for 5 or more days each week, or as told by your doctor.  Wear loose-fitting clothes.  Do not smoke. If you need help quitting, ask your doctor.  Sleep with the head of your bed higher than your feet. Use a wedge under the mattress or blocks under the bed frame to raise the head of the bed. Summary  When you have gastroesophageal reflux disease (GERD), food and lifestyle choices are very important in easing your symptoms.  Eat small meals often instead of 3 large meals a day. Eat your meals slowly, and in a place where you are relaxed.  Limit high-fat foods such as fatty meat or fried foods.  Avoid bending over or lying down until 2-3 hours after eating.  Avoid peppermint and spearmint, caffeine, alcohol, and chocolate. This information is not intended to replace advice given to you by your health care provider. Make sure you discuss any questions you have with your health care provider. Document Revised: 03/26/2019 Document Reviewed: 01/08/2017 Elsevier Patient Education  [place].

## 2020-10-04 ENCOUNTER — Encounter: Payer: Self-pay | Admitting: Family Medicine

## 2020-10-06 ENCOUNTER — Ambulatory Visit (INDEPENDENT_AMBULATORY_CARE_PROVIDER_SITE_OTHER): Payer: No Typology Code available for payment source | Admitting: Family Medicine

## 2020-10-06 ENCOUNTER — Other Ambulatory Visit: Payer: Self-pay

## 2020-10-06 ENCOUNTER — Encounter: Payer: Self-pay | Admitting: Family Medicine

## 2020-10-06 VITALS — BP 120/86 | HR 63 | Temp 98.3°F | Wt 148.6 lb

## 2020-10-06 DIAGNOSIS — R053 Chronic cough: Secondary | ICD-10-CM | POA: Diagnosis not present

## 2020-10-06 DIAGNOSIS — J45901 Unspecified asthma with (acute) exacerbation: Secondary | ICD-10-CM

## 2020-10-06 MED ORDER — DEXAMETHASONE SODIUM PHOSPHATE 120 MG/30ML IJ SOLN: 80.0000 mg | Freq: Once | INTRAMUSCULAR | Status: DC

## 2020-10-06 MED ORDER — HYDROCOD POLST-CPM POLST ER 10-8 MG/5ML PO SUER
5.0000 mL | Freq: Two times a day (BID) | ORAL | 0 refills | Status: DC | PRN
Start: 2020-10-06 — End: 2021-01-17

## 2020-10-06 NOTE — Patient Instructions (Signed)
Please return to discuss your cough with Dr. Yong Channel.   I've ordered the cough syrup.  You were given decadron in the office today.  Hope this helps!  If you have any questions or concerns, please don't hesitate to send me a message via MyChart or call the office at [phone]. Thank you for visiting with [place] today! It's our pleasure caring for you.

## 2020-10-06 NOTE — Progress Notes (Signed)
Subjective  CC:  Chief Complaint  Patient presents with  . dry cough    several negative COVID tests, states this happens yearly   Same day acute visit; PCP not available. New pt to me. Chart reviewed.   HPI: Erica Gordon is a 49 y.o. female who presents to the office today to address the problems listed above in the chief complaint.  I reviewed recent telehealth visit from earlier this month regarding cough.  She reports that annually she gets this cough: treats with decadron and tussionex to get rid of it. Hasn't been given a diagnosis. Has had h/o gerd induced cough but didn't tolerate PPIs (memory issues). Denies active gerd now. Cough is more spasmodic. Nonproductive. Denies pnd or sob or pleuritic cp. No wheezing or dx of asthma. No infectious sxs now. Ongoing 4-6 weeks.    Assessment  1. Chronic cough   2. Reactive airway disease with acute exacerbation, unspecified asthma severity, unspecified whether persistent      Plan   cough:  Needs further eval. Discussed possible etiologies with patient. Agree to treat with decadron IM and tussionex but recommend f/u with pcp for further investigation. Pt agrees.   Follow up: with pcp for f/u  Visit date not found  No orders of the defined types were placed in this encounter.  Meds ordered this encounter  Medications  . chlorpheniramine-HYDROcodone (TUSSIONEX PENNKINETIC ER) 10-8 MG/5ML SUER    Sig: Take 5 mLs by mouth every 12 (twelve) hours as needed for cough.    Dispense:  140 mL    Refill:  0  . dexamethasone (DECADRON) injection 80 mg      I reviewed the patients updated PMH, FH, and SocHx.    Patient Active Problem List   Diagnosis Date Noted  . Genetic testing 08/08/2020  . Monoallelic mutation of CHEK2 gene in female patient 08/08/2020  . Family history of genetic disorder 07/22/2020  . Family history of prostate cancer 07/22/2020  . Family history of ovarian cancer 07/12/2020  . Hyperlipidemia 10/28/2017    . Raynaud phenomenon 08/10/2016  . 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 Blood in stool 07/19/2010  . IBS 04/21/2010  . GERD 01/20/2009  . Migraine 01/20/2009   Current Meds  Medication Sig  . Calcium-Magnesium-Vitamin D (CALCIUM MAGNESIUM PO) Take by mouth. 2 by mouth at bedtime   . Cholecalciferol (VITAMIN D3) 2000 UNITS TABS Take by mouth daily.    Marland Kitchen ibuprofen (ADVIL,MOTRIN) 200 MG tablet Take 200 mg by mouth as needed.    . loratadine (CLARITIN) 10 MG tablet Take 10 mg by mouth daily.  . Multiple Vitamin (MULTIVITAMIN) tablet Take 1 tablet by mouth daily.    . OMEGA 3 1000 MG CAPS Take by mouth daily.    . rizatriptan (MAXALT) 10 MG tablet May repeat in 2 hours if needed  . [DISCONTINUED] predniSONE (DELTASONE) 20 MG tablet Take 2 tablets (40 mg total) by mouth daily with breakfast.   Current Facility-Administered Medications for the 10/06/20 encounter (Office Visit) with Leamon Arnt, MD  Medication  . dexamethasone (DECADRON) injection 80 mg    Allergies: Patient is allergic to clarithromycin. Family History: Patient family history includes Brain cancer in her mother; Breast cancer (age of onset: 26) in her mother; Cervical cancer in her sister; Heart disease in her paternal grandfather; Heart disease (age of onset: 65) in her father; Hyperlipidemia in her paternal grandfather; Hyperthyroidism in her sister; Hypothyroidism in her father; Non-Hodgkin's lymphoma (age of onset:  25) in her mother; Other in her paternal aunt; Other (age of onset: 52) in her paternal aunt; Ovarian cancer (age of onset: 26) in her paternal aunt; Prostate cancer (age of onset: 2) in her brother; Prostate cancer (age of onset: 27) in her father; Uterine cancer in her maternal grandmother. Social History:  Patient  reports that she has never smoked. She has never used smokeless tobacco. She reports current alcohol use of about 8.0 standard drinks of alcohol per week. She reports that she does not use  drugs.  Review of Systems: Constitutional: Negative for fever malaise or anorexia Cardiovascular: negative for chest pain Respiratory: negative for SOB or persistent cough Gastrointestinal: negative for abdominal pain  Objective  Vitals: BP 120/86   Pulse 63   Temp 98.3 F (36.8 C) (Temporal)   Wt 148 lb 9.6 oz (67.4 kg)   SpO2 97%   BMI 23.45 kg/m  General: no acute distress , A&Ox3 HEENT: PEERL, conjunctiva normal, neck is supple, spasms of cough present Cardiovascular:  RRR without murmur or gallop.  Respiratory:  Good breath sounds bilaterally, CTAB with normal respiratory effort Skin:  Warm, no rashes     Commons side effects, risks, benefits, and alternatives for medications and treatment plan prescribed today were discussed, and the patient expressed understanding of the given instructions. Patient is instructed to call or message via MyChart if he/she has any questions or concerns regarding our treatment plan. No barriers to understanding were identified. We discussed Red Flag symptoms and signs in detail. Patient expressed understanding regarding what to do in case of urgent or emergency type symptoms.   Medication list was reconciled, printed and provided to the patient in AVS. Patient instructions and summary information was reviewed with the patient as documented in the AVS. This note was prepared with assistance of Dragon voice recognition software. Occasional wrong-word or sound-a-like substitutions may have occurred due to the inherent limitations of voice recognition software  This visit occurred during the SARS-CoV-2 public health emergency.  Safety protocols were in place, including screening questions prior to the visit, additional usage of staff PPE, and extensive cleaning of exam room while observing appropriate contact time as indicated for disinfecting solutions.

## 2020-11-23 ENCOUNTER — Other Ambulatory Visit: Payer: Self-pay | Admitting: Obstetrics and Gynecology

## 2020-11-23 DIAGNOSIS — E281 Androgen excess: Secondary | ICD-10-CM

## 2020-12-12 LAB — HM MAMMOGRAPHY

## 2020-12-13 ENCOUNTER — Other Ambulatory Visit: Payer: Self-pay

## 2020-12-13 ENCOUNTER — Ambulatory Visit (AMBULATORY_SURGERY_CENTER): Payer: Self-pay | Admitting: *Deleted

## 2020-12-13 VITALS — Ht 66.75 in | Wt 145.0 lb

## 2020-12-13 DIAGNOSIS — Z1211 Encounter for screening for malignant neoplasm of colon: Secondary | ICD-10-CM

## 2020-12-13 MED ORDER — PLENVU 140 G PO SOLR: 1.0000 | Freq: Once | ORAL | 0 refills | Status: AC

## 2020-12-13 NOTE — Progress Notes (Signed)
Patient is here in-person for PV. Patient denies any allergies to eggs or soy. Patient denies any problems with anesthesia/sedation. Patient denies any oxygen use at home. Patient denies taking any diet/weight loss medications or blood thinners. Patient is not being treated for MRSA or C-diff. Patient is aware of our care-partner policy and Covid-19 safety protocol. EMMI education assigned to the patient for the procedure, sent to MyChart.   COVID-19 vaccines completed on 03/05/20 x2, per patient.   Prep Prescription coupon given to the patient. 

## 2020-12-14 ENCOUNTER — Ambulatory Visit
Admission: RE | Admit: 2020-12-14 | Discharge: 2020-12-14 | Disposition: A | Discharge status: Home / Self Care | Payer: No Typology Code available for payment source | Source: Ambulatory Visit | Attending: Obstetrics and Gynecology | Admitting: Obstetrics and Gynecology

## 2020-12-14 DIAGNOSIS — E281 Androgen excess: Secondary | ICD-10-CM

## 2020-12-14 MED ORDER — GADOBENATE DIMEGLUMINE 529 MG/ML IV SOLN
13.0000 mL | Freq: Once | INTRAVENOUS | Status: AC | PRN
  Administered 2020-12-14: 13 mL via INTRAVENOUS

## 2020-12-21 ENCOUNTER — Encounter: Payer: Self-pay | Admitting: Gastroenterology

## 2020-12-22 ENCOUNTER — Other Ambulatory Visit: Payer: No Typology Code available for payment source

## 2020-12-22 ENCOUNTER — Other Ambulatory Visit: Payer: Self-pay | Admitting: Obstetrics and Gynecology

## 2020-12-22 DIAGNOSIS — E281 Androgen excess: Secondary | ICD-10-CM

## 2020-12-24 ENCOUNTER — Other Ambulatory Visit: Payer: Self-pay

## 2020-12-24 ENCOUNTER — Ambulatory Visit
Admission: RE | Admit: 2020-12-24 | Discharge: 2020-12-24 | Disposition: A | Discharge status: Home / Self Care | Payer: No Typology Code available for payment source | Source: Ambulatory Visit | Attending: Obstetrics and Gynecology | Admitting: Obstetrics and Gynecology

## 2020-12-24 MED ORDER — GADOBENATE DIMEGLUMINE 529 MG/ML IV SOLN
13.0000 mL | Freq: Once | INTRAVENOUS | Status: AC | PRN
  Administered 2020-12-24: 13 mL via INTRAVENOUS

## 2020-12-27 ENCOUNTER — Encounter: Payer: Self-pay | Admitting: Gastroenterology

## 2020-12-27 ENCOUNTER — Other Ambulatory Visit: Payer: Self-pay

## 2020-12-27 ENCOUNTER — Ambulatory Visit (AMBULATORY_SURGERY_CENTER): Payer: No Typology Code available for payment source | Admitting: Gastroenterology

## 2020-12-27 VITALS — BP 115/67 | HR 74 | Temp 98.9°F | Resp 15 | Ht 66.0 in | Wt 145.0 lb

## 2020-12-27 DIAGNOSIS — Z1211 Encounter for screening for malignant neoplasm of colon: Secondary | ICD-10-CM

## 2020-12-27 DIAGNOSIS — Z1509 Genetic susceptibility to other malignant neoplasm: Secondary | ICD-10-CM

## 2020-12-27 MED ORDER — SODIUM CHLORIDE 0.9 % IV SOLN: 500.0000 mL | Freq: Once | INTRAVENOUS | Status: DC

## 2020-12-27 NOTE — Patient Instructions (Signed)
You may resume your previous medications.  Read all of the handouts given to you by your recovery room nurse.  YOU HAD AN ENDOSCOPIC PROCEDURE TODAY AT [facility]:   Refer to the procedure report that was given to you for any specific questions about what was found during the examination.  If the procedure report does not answer your questions, please call your gastroenterologist to clarify.  If you requested that your care partner not be given the details of your procedure findings, then the procedure report has been included in a sealed envelope for you to review at your convenience later.  YOU SHOULD EXPECT: Some feelings of bloating in the abdomen. Passage of more gas than usual.  Walking can help get rid of the air that was put into your GI tract during the procedure and reduce the bloating. If you had a lower endoscopy (such as a colonoscopy or flexible sigmoidoscopy) you may notice spotting of blood in your stool or on the toilet paper. If you underwent a bowel prep for your procedure, you may not have a normal bowel movement for a few days.  Please Note:  You might notice some irritation and congestion in your nose or some drainage.  This is from the oxygen used during your procedure.  There is no need for concern and it should clear up in a day or so.  SYMPTOMS TO REPORT IMMEDIATELY:   Following lower endoscopy (colonoscopy or flexible sigmoidoscopy):  Excessive amounts of blood in the stool  Significant tenderness or worsening of abdominal pains  Swelling of the abdomen that is new, acute  Fever of 100F or higher   For urgent or emergent issues, a gastroenterologist can be reached at any hour by calling [phone]. Do not use MyChart messaging for urgent concerns.    DIET:  We do recommend a small meal at first, but then you may proceed to your regular diet.  Drink plenty of fluids but you should avoid alcoholic beverages for 24 hours. Try to increase the fiber  in your diet, and drink plenty of water.  ACTIVITY:  You should plan to take it easy for the rest of today and you should NOT DRIVE or use heavy machinery until tomorrow (because of the sedation medicines used during the test).    FOLLOW UP: Our staff will call the number listed on your records 48-72 hours following your procedure to check on you and address any questions or concerns that you may have regarding the information given to you following your procedure. If we do not reach you, we will leave a message.  We will attempt to reach you two times.  During this call, we will ask if you have developed any symptoms of COVID 19. If you develop any symptoms (ie: fever, flu-like symptoms, shortness of breath, cough etc.) before then, please call [phone].  If you test positive for Covid 19 in the 2 weeks post procedure, please call and report this information to [place].     SIGNATURES/CONFIDENTIALITY: You and/or your care partner have signed paperwork which will be entered into your electronic medical record.  These signatures attest to the fact that that the information above on your After Visit Summary has been reviewed and is understood.  Full responsibility of the confidentiality of this discharge information lies with you and/or your care-partner.

## 2020-12-27 NOTE — Progress Notes (Signed)
pt tolerated well. VSS. awake and to recovery. Report given to RN.  

## 2020-12-27 NOTE — Op Note (Signed)
[facility] Patient Name: Erica Gordon Procedure Date: 12/27/2020 8:51 AM MRN: [id number] Endoscopist: Ladene Artist , MD Age: 50 Referring MD:  Date of Birth: 1971-09-14 Gender: Female Account #: [id number] Procedure:                Colonoscopy Indications:              Colon cancer screening in patient at increased                            risk: Genetic susceptibility to colon cancer, CHEK2                            postitive. Medicines:                Monitored Anesthesia Care Procedure:                Pre-Anesthesia Assessment:                           - Prior to the procedure, a History and Physical                            was performed, and patient medications and                            allergies were reviewed. The patient's tolerance of                            previous anesthesia was also reviewed. The risks                            and benefits of the procedure and the sedation                            options and risks were discussed with the patient.                            All questions were answered, and informed consent                            was obtained. Prior Anticoagulants: The patient has                            taken no previous anticoagulant or antiplatelet                            agents. ASA Grade Assessment: II - A patient with                            mild systemic disease. After reviewing the risks                            and benefits, the patient was deemed in  satisfactory condition to undergo the procedure.                           After obtaining informed consent, the colonoscope                            was passed under direct vision. Throughout the                            procedure, the patient's blood pressure, pulse, and                            oxygen saturations were monitored continuously. The                            Olympus PCF-H190DL [id number]) Colonoscope was                             introduced through the anus and advanced to the the                            cecum, identified by appendiceal orifice and                            ileocecal valve. The ileocecal valve, appendiceal                            orifice, and rectum were photographed. The quality                            of the bowel preparation was good. The colonoscopy                            was performed without difficulty. The patient                            tolerated the procedure well. Scope In: 9:03:52 AM Scope Out: 9:23:55 AM Scope Withdrawal Time: 0 hours 12 minutes 16 seconds  Total Procedure Duration: 0 hours 20 minutes 3 seconds  Findings:                 The perianal and digital rectal examinations were                            normal.                           Internal hemorrhoids were found during                            retroflexion. The hemorrhoids were small and Grade                            I (internal hemorrhoids that do not prolapse).  The exam was otherwise without abnormality on                            direct and retroflexion views. Complications:            No immediate complications. Estimated blood loss:                            None. Estimated Blood Loss:     Estimated blood loss: none. Impression:               - Internal hemorrhoids.                           - The examination was otherwise normal on direct                            and retroflexion views.                           - No specimens collected. Recommendation:           - Repeat colonoscopy in 5 years for screening                            purposes.                           - Patient has a contact number available for                            emergencies. The signs and symptoms of potential                            delayed complications were discussed with the                            patient. Return to normal activities tomorrow.                             Written discharge instructions were provided to the                            patient.                           - Resume previous diet.                           - Continue present medications. Ladene Artist, MD 12/27/2020 9:30:03 AM This report has been signed electronically.

## 2020-12-29 ENCOUNTER — Telehealth: Payer: Self-pay

## 2020-12-29 NOTE — Telephone Encounter (Signed)
  Follow up Call-  Call back number 12/27/2020  Post procedure Call Back phone  # [phone]  Permission to leave phone message Yes  Some recent data might be hidden     1st follow up call made.  NALM

## 2020-12-29 NOTE — Telephone Encounter (Signed)
  Follow up Call-  Call back number 12/27/2020  Post procedure Call Back phone  # [phone]  Permission to leave phone message Yes  Some recent data might be hidden     Patient questions:  Do you have a fever, pain , or abdominal swelling? No. Pain Score  0 *  Have you tolerated food without any problems? Yes.    Have you been able to return to your normal activities? Yes.    Do you have any questions about your discharge instructions: Diet   No. Medications  No. Follow up visit  No.  Do you have questions or concerns about your Care? No.  Actions: * If pain score is 4 or above: No action needed, pain <4. 1. Have you developed a fever since your procedure? no  2.   Have you had an respiratory symptoms (SOB or cough) since your procedure? yes  3.   Have you tested positive for COVID 19 since your procedure yes  4.   Have you had any family members/close contacts diagnosed with the COVID 19 since your procedure?  No  Patient started with headache, sore throat and congestion last night and took a home Covid test this morning.  It was positive.   If yes to any of these questions please route to Joylene John, RN and Joella Prince, RN

## 2021-01-04 ENCOUNTER — Other Ambulatory Visit: Payer: Self-pay | Admitting: Obstetrics and Gynecology

## 2021-01-17 ENCOUNTER — Other Ambulatory Visit (HOSPITAL_COMMUNITY)
Admission: RE | Admit: 2021-01-17 | Discharge: 2021-01-17 | Disposition: A | Discharge status: Home / Self Care | Payer: No Typology Code available for payment source | Source: Ambulatory Visit | Attending: Obstetrics and Gynecology | Admitting: Obstetrics and Gynecology

## 2021-01-17 ENCOUNTER — Other Ambulatory Visit (HOSPITAL_COMMUNITY): Payer: No Typology Code available for payment source

## 2021-01-17 ENCOUNTER — Encounter (HOSPITAL_BASED_OUTPATIENT_CLINIC_OR_DEPARTMENT_OTHER): Payer: Self-pay | Admitting: Obstetrics and Gynecology

## 2021-01-17 ENCOUNTER — Encounter (HOSPITAL_COMMUNITY)
Admission: RE | Admit: 2021-01-17 | Discharge: 2021-01-17 | Disposition: A | Discharge status: Home / Self Care | Payer: No Typology Code available for payment source | Source: Ambulatory Visit | Attending: Obstetrics and Gynecology | Admitting: Obstetrics and Gynecology

## 2021-01-17 ENCOUNTER — Other Ambulatory Visit: Payer: Self-pay

## 2021-01-17 DIAGNOSIS — Z01812 Encounter for preprocedural laboratory examination: Secondary | ICD-10-CM | POA: Insufficient documentation

## 2021-01-17 DIAGNOSIS — U071 COVID-19: Secondary | ICD-10-CM

## 2021-01-17 HISTORY — DX: COVID-19: U07.1

## 2021-01-17 LAB — BASIC METABOLIC PANEL
Anion gap: 10 (ref 5–15)
BUN: 19 mg/dL (ref 6–20)
CO2: 28 mmol/L (ref 22–32)
Calcium: 9.8 mg/dL (ref 8.9–10.3)
Chloride: 104 mmol/L (ref 98–111)
Creatinine, Ser: 0.77 mg/dL (ref 0.44–1.00)
GFR, Estimated: >60 mL/min (ref >60)
Glucose, Bld: 102 mg/dL — ABNORMAL HIGH (ref 70–99)
Potassium: 4.3 mmol/L (ref 3.5–5.1)
Sodium: 142 mmol/L (ref 135–145)

## 2021-01-17 LAB — CBC
HCT: 43.8 % (ref 36.0–46.0)
Hemoglobin: 14.8 g/dL (ref 12.0–15.0)
MCH: 31.8 pg (ref 26.0–34.0)
MCHC: 33.8 g/dL (ref 30.0–36.0)
MCV: 94 fL (ref 80.0–100.0)
Platelets: 249 10*3/uL (ref 150–400)
RBC: 4.66 MIL/uL (ref 3.87–5.11)
RDW: 11.7 % (ref 11.5–15.5)
WBC: 7.1 10*3/uL (ref 4.0–10.5)
nRBC: 0 % (ref 0.0–0.2)

## 2021-01-17 LAB — SARS CORONAVIRUS 2 (TAT 6-24 HRS): SARS Coronavirus 2: POSITIVE — AB

## 2021-01-17 NOTE — H&P (Signed)
Erica Gordon is a 50 y.o. female, P: 2-0-0-2 who presents for hysterectomy because of chronic pelvic pain and uterine fibroids. The patient is status post endometrial ablation since 2011 with no concerns though she continued to have some bleeding each month after the procedure. Six months ago she developed, intermittent sharp pelvic pains that would occur most days of the week and last up to 30 minutes. These pains would decrease with Ibuprofen 600 mg but would escalate during her menses. She has not been able to identify any triggers.  In recent months these pains have begun to occur almost daily,  though still intermittent. Her menstrual flow will last for 4 days and require a pad change 3 times a day. During this time she has noticed that she will have problems completely emptying her bladder and though she has no dyspareunia, she report excruciating pain with orgasm. She denies any dysuria, urinary frequency, urgency, incontinence or vaginitis symptoms. A pelvic ultrasound in December 2021 revealed an anteverted uterus: (159.9 cc ) measuring  6.40 x 7.07 x 6.75 cm, endometrium: 6.3 mm; #5 fibroids: fundal right sub-serosal-2.26 cm, right fundal intramural-2.94 cm, anterior intramural-3.33 cm, posterior left sub-serosal-3.32 cm and left sub-serosal-2.56 cm;  right ovary- 3.45 cm and left ovary-3.07 cm. She also had a abdominal/ pelvic MRI at the same time to follow up elevated androgen levels with findings that corresponded to  the ultrasound,   though her left ovary and left adrenal gland was not visible.  the patient was given both medical and surgical management options for consideration. Given the chronicity and disruptive nature of her symptoms, however,  the patient has decided to proceed with hysterectomy.   Past Medical History  OB History: G: 2;  P: 2-0-0-2;  SVB: 1998 and 2000, largest infant: 8 lbs. 13 oz.  GYN History: menarche: 50 YO;  LMP: 12/20/2020;  Contraception: Vasectomy;  Denies history  of abnormal PAP smear.  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 Biaxin [Clarithromycin] Other (See Comments)    REACTION: METALLIC TASTE     Denies sensitivity to peanuts, shellfish, soy, latex or adhesives.  ROS: Admits to glasses/contact lenses and monthly migraines but denies  vision changes, nasal congestion, dysphagia, tinnitus, dizziness, hoarseness, cough,  chest pain, shortness of breath, nausea, vomiting, diarrhea,constipation,  urinary frequency, urgency  dysuria, hematuria, vaginitis symptoms, pelvic pain, swelling of joints,easy bruising,  myalgias, arthralgias, skin rashes, unexplained weight loss and except as is mentioned in the history of present illness, patient's review of systems is otherwise negative.   Physical Exam  Bp: 130/78;    P:  81 bpm;   R: 16   Temperature:  98.3 degrees F orally;     Weight: 142 lbs.;    Height: 5'7";   BMI: 22.2  Neck: supple without masses or thyromegaly Lungs: clear to auscultation Heart: regular rate and rhythm Abdomen: soft, non-tender and no organomegaly Pelvic:EGBUS- wnl; vagina-normal rugae;  uterus-normal size, cervix without lesions or motion tenderness; adnexae-no tenderness or masses Extremities:  no clubbing, cyanosis or edema   Assesment:   Chronic Pelvic Pain                       Uterine Fibroids                       Elevated Androgens   Disposition: Disposition:  A discussion was held with patient regarding the indication for her procedure(s) along with risks and benefits.  The  robot assisted hysterectomy benefits include but are not limited to:  less post-operative pain, less blood loss during surgery, reduced risk of injury to other organs due to better visualization with a 3-D HD 10 times magnifying camera, shorter hospital stay between 0-1 night and rapid recovery with return to daily routine in 2-3 weeks. Although robot assisted hysterectomy has a longer operative time than traditional laparotomy, the benefits usually outweigh the risks  in a patient with good medical history.   Risks include, but are not limited to:  reaction to anesthesia, bleeding, infection, injury to other organs, need for laparotomy, transient post-operative facial edema, increased risk of pelvic prolapse- associated with any hysterectomy as well as earlier   onset of menopause. Preservation or preventative removal of the ovaries was also reviewed and left to the patient's discretion.  The patient verbalized understanding of these risks and  has consented to proceed with a Robot Assisted Total Laparoscopic Hysterectomy with Bilateral Salpingectomy and Possible Left Oophorectomy at [facility] on February 16, 2021 at 7:30 a.m.  CSN# [id number]   Johnedward Brodrick J. Florene Glen, PA-C  for Dr. Dede Query. [place]

## 2021-01-17 NOTE — Progress Notes (Signed)
Spoke w/ via phone for pre-op interview---pt Lab needs dos----     Urine preg          Lab results------lab appt 01-17-2021 at 130 pm for cbc bmet, t & s COVID test ------01-17-2021 at 245 pm Arrive at -------530 am 01-19-2021 NPO after MN NO Solid Food.  Clear liquids from MN until---430 am then npo, drink ensure pre surgery drink at 430 am  Medications to take morning of surgery -----loratadine prn Diabetic medication -----n/a Patient Special Instructions -----none Pre-Op special Istructions -----none Patient verbalized understanding of instructions that were given at this phone interview. Patient denies shortness of breath, chest pain, fever, cough at this phone interview.

## 2021-01-17 NOTE — Progress Notes (Addendum)
YOU ARE SCHEDULED FOR A COVID TEST  01-17-2021 @ 230 PM. THIS TEST MUST BE DONE BEFORE SURGERY. GO TO  [place]. [place], [place], IT IS APPROXIMATELY 2 MINUTES PAST [employer] ON THE RIGHT AND REMAIN IN YOUR CAR, THIS IS A DRIVE UP TEST. ONCE YOUR COVID TEST IS DONE PLEASE FOLLOW ALL THE QUARANTINE  INSTRUCTIONS GIVEN IN YOUR HANDOUT.      Your procedure is scheduled on 01-19-2021  Report to [facility].   Call this number if you have problems the morning of surgery  :[phone].   OUR ADDRESS IS [address].  WE ARE LOCATED IN [facility].  PLEASE BRING YOUR INSURANCE CARD AND PHOTO ID DAY OF SURGERY.  ONLY ONE PERSON ALLOWED IN FACILITY WAITING AREA.                                     REMEMBER:  DO NOT EAT FOOD, CANDY GUM OR MINTS  AFTER MIDNIGHT . YOU MAY HAVE CLEAR LIQUIDS FROM MIDNIGHT UNTIL  430 AM. NO CLEAR LIQUIDS AFTER  430 AM DAY OF SURGERY.NO SOLID FOOD AFTER MIDNIGHT THE NIGHT PRIOR TO SURGERY. NOTHING BY MOUTH EXCEPT CLEAR LIQUIDS UNTIL  430 AM. PLEASE FINISH ENSURE DRINK PER SURGEON ORDER  WHICH NEEDS TO BE COMPLETED AT 430 AM .   YOU MAY  BRUSH YOUR TEETH MORNING OF SURGERY AND RINSE YOUR MOUTH OUT, NO CHEWING GUM CANDY OR MINTS.    CLEAR LIQUID DIET   Foods Allowed                                                                     Foods Excluded  Coffee and tea, regular and decaf                             liquids that you cannot  Plain Jell-O any favor except red or purple                                           see through such as: Fruit ices (not with fruit pulp)                                     milk, soups, orange juice  Iced Popsicles                                    All solid food Carbonated beverages, regular and diet                                    Cranberry, grape and apple juices Sports drinks like Gatorade Lightly seasoned clear broth or consume(fat free) Sugar, honey  syrup  Sample Menu  Breakfast                                Lunch                                     Supper Cranberry juice                    Beef broth                            Chicken broth Jell-O                                     Grape juice                           Apple juice Coffee or tea                        Jell-O                                      Popsicle                                                Coffee or tea                        Coffee or tea  _____________________________________________________________________     TAKE THESE MEDICATIONS MORNING OF SURGERY WITH A SIP OF WATER:  LORATADINE IF NEEDED  ONE VISITOR IS ALLOWED IN WAITING ROOM ONLY DAY OF SURGERY.  NO VISITOR MAY SPEND THE NIGHT.  VISITOR ARE ALLOWED TO STAY UNTIL 800 PM.                                    DO NOT WEAR JEWERLY, MAKE UP, OR NAIL POLISH ON FINGERNAILS. DO NOT WEAR LOTIONS, POWDERS, PERFUMES OR DEODORANT. DO NOT SHAVE FOR 24 HOURS PRIOR TO DAY OF SURGERY. MEN MAY SHAVE FACE AND NECK. CONTACTS, GLASSES, OR DENTURES MAY NOT BE WORN TO SURGERY.                                    [facility] IS NOT RESPONSIBLE  FOR ANY BELONGINGS.                                                                    Marland Kitchen           [place] [facility] - Preparing for Surgery Before surgery, you can play an important role.  Because skin is  not sterile, your skin needs to be as free of germs as possible.  You can reduce the number of germs on your skin by washing with CHG (chlorahexidine gluconate) soap before surgery.  CHG is an antiseptic cleaner which kills germs and bonds with the skin to continue killing germs even after washing. Please DO NOT use if you have an allergy to CHG or antibacterial soaps.  If your skin becomes reddened/irritated stop using the CHG and inform your nurse when you arrive at Short Stay. Do not shave (including legs and underarms) for at least 48 hours prior to the first CHG shower.  You may  shave your face/neck. Please follow these instructions carefully:  1.  Shower with CHG Soap the night before surgery and the  morning of Surgery.  2.  If you choose to wash your hair, wash your hair first as usual with your  normal  shampoo.  3.  After you shampoo, rinse your hair and body thoroughly to remove the  shampoo.                           4.  Use CHG as you would any other liquid soap.  You can apply chg directly  to the skin and wash                       Gently with a scrungie or clean washcloth.  5.  Apply the CHG Soap to your body ONLY FROM THE NECK DOWN.   Do not use on face/ open                           Wound or open sores. Avoid contact with eyes, ears mouth and genitals (private parts).                       Wash face,  Genitals (private parts) with your normal soap.             6.  Wash thoroughly, paying special attention to the area where your surgery  will be performed.  7.  Thoroughly rinse your body with warm water from the neck down.  8.  DO NOT shower/wash with your normal soap after using and rinsing off  the CHG Soap.                9.  Pat yourself dry with a clean towel.            10.  Wear clean pajamas.            11.  Place clean sheets on your bed the night of your first shower and do not  sleep with pets. Day of Surgery : Do not apply any lotions/deodorants the morning of surgery.  Please wear clean clothes to the hospital/surgery center.  FAILURE TO FOLLOW THESE INSTRUCTIONS MAY RESULT IN THE CANCELLATION OF YOUR SURGERY PATIENT SIGNATURE_________________________________  NURSE SIGNATURE__________________________________  ________________________________________________________________________                          QUESTIONS Hansel Feinstein PRE OP NURSE AT [phone]

## 2021-01-18 ENCOUNTER — Telehealth: Payer: Self-pay | Admitting: *Deleted

## 2021-01-18 NOTE — Progress Notes (Signed)
Called Erica Gordon from Dr. Boyd Kerbs office with + covid results for this pt. The pt is to have surgery on Thurs 01/19/21 at 0730 at [facility].   These are the current guidelines:  Positive Results for:  Asymptomatic: Procedure postponed and quarantined for 10 days, unless the procedure is needed right away. Symptomatic: Procedure postponed and quarantined for 14 days. Hospitalized with Covid: postponed and quarantined  for 21 days. Immunocompromised: Procedure postponed and quarantine for 20 days.  The pt will not be retested for 90 days from the + result.

## 2021-01-18 NOTE — Telephone Encounter (Signed)
Called to discuss with patient about COVID-19 symptoms and the use of one of the available treatments for those with mild to moderate Covid symptoms and at a high risk of hospitalization.  Pt appears to qualify for outpatient treatment due to co-morbid conditions and/or a member of an at-risk group in accordance with the FDA Emergency Use Authorization.   Currently, no symptoms.       Erica Gordon

## 2021-01-19 LAB — TYPE AND SCREEN
ABO/RH(D): A POS
Antibody Screen: NEGATIVE

## 2021-01-23 ENCOUNTER — Other Ambulatory Visit (HOSPITAL_COMMUNITY): Payer: No Typology Code available for payment source

## 2021-01-23 ENCOUNTER — Encounter (HOSPITAL_COMMUNITY): Payer: No Typology Code available for payment source

## 2021-02-08 ENCOUNTER — Other Ambulatory Visit: Payer: Self-pay

## 2021-02-08 ENCOUNTER — Encounter (HOSPITAL_BASED_OUTPATIENT_CLINIC_OR_DEPARTMENT_OTHER): Payer: Self-pay | Admitting: Obstetrics and Gynecology

## 2021-02-08 NOTE — Progress Notes (Signed)
Spoke w/ via phone for pre-op interview---pt Lab needs dos---- urine preg              Lab results------lab appt 02-13-2021 945 am for cbc bmp t & s COVID test ------positive covid 01-17-2021 epic Arrive at -------1000 am 02-16-2021 NPO after MN NO Solid Food.  Clear liquids from MN until---1000 am drink ensure presurgery drink at 1000 am Medications to take morning of surgery -----loratadine Diabetic medication -----n/a Patient Special Instructions -----none Pre-Op special Istructions -----none Patient verbalized understanding of instructions that were given at this phone interview. Patient denies shortness of breath, chest pain, fever, cough at this phone interview.

## 2021-02-08 NOTE — Progress Notes (Addendum)
Your procedure is scheduled on 3-3--2022  Report to [facility].   Call this number if you have problems the morning of surgery  [phone].   OUR ADDRESS IS [place].  WE [facility]ATED [facility].  PLEASE BRING YOUR INSURANCE CARD AND PHOTO ID DAY OF SURGERY.  ONLY ONE PERSON ALLOWED IN FACILITY WAITING AREA.                                     REMEMBER:  DO NOT EAT FOOD, CANDY GUM OR MINTS  AFTER MIDNIGHT . YOU MAY HAVE CLEAR LIQUIDS FROM MIDNIGHT UNTIL 1000 AM. NO CLEAR LIQUIDS AFTER  1000 AM DAY OF SURGERY.NO SOLID FOOD AFTER MIDNIGHT THE NIGHT PRIOR TO SURGERY. NOTHING BY MOUTH EXCEPT CLEAR LIQUIDS UNTIL  1000 AM. PLEASE FINISH ENSURE DRINK PER SURGEON ORDER  WHICH NEEDS TO BE COMPLETED AT 1000 AM .   YOU MAY  BRUSH YOUR TEETH MORNING OF SURGERY AND RINSE YOUR MOUTH OUT, NO CHEWING GUM CANDY OR MINTS.    CLEAR LIQUID DIET   Foods Allowed                                                                     Foods Excluded  Coffee and tea, regular and decaf                             liquids that you cannot  Plain Jell-O any favor except red or purple                                           see through such as: Fruit ices (not with fruit pulp)                                     milk, soups, orange juice  Iced Popsicles                                    All solid food Carbonated beverages, regular and diet                                    Cranberry, grape and apple juices Sports drinks like Gatorade Lightly seasoned clear broth or consume(fat free) Sugar, honey syrup  Sample Menu Breakfast                                Lunch                                     Supper Cranberry juice  Beef broth                            Chicken broth Jell-O                                     Grape juice                           Apple juice Coffee or tea                        Jell-O                                       Popsicle                                                Coffee or tea                        Coffee or tea  _____________________________________________________________________     TAKE THESE MEDICATIONS MORNING OF SURGERY WITH A SIP OF WATER:  LORATADINE IF NEEDED  ONE VISITOR IS ALLOWED IN WAITING ROOM ONLY DAY OF SURGERY.  NO VISITOR MAY SPEND THE NIGHT.  VISITOR ARE ALLOWED TO STAY UNTIL 800 PM.                                    DO NOT WEAR JEWERLY, MAKE UP, OR NAIL POLISH ON FINGERNAILS. DO NOT WEAR LOTIONS, POWDERS, PERFUMES OR DEODORANT. DO NOT SHAVE FOR 24 HOURS PRIOR TO DAY OF SURGERY. MEN MAY SHAVE FACE AND NECK. CONTACTS, GLASSES, OR DENTURES MAY NOT BE WORN TO SURGERY.                                    [facility] IS NOT RESPONSIBLE  FOR ANY BELONGINGS.                                                                    Marland Kitchen           [facility] - Preparing for Surgery Before surgery, you can play an important role.  Because skin is not sterile, your skin needs to be as free of germs as possible.  You can reduce the number of germs on your skin by washing with CHG (chlorahexidine gluconate) soap before surgery.  CHG is an antiseptic cleaner which kills germs and bonds with the skin to continue killing germs even after washing. Please DO NOT use if you have an allergy to CHG or antibacterial soaps.  If your skin becomes reddened/irritated stop using the CHG and inform your nurse when you arrive at Short Stay. Do not  shave (including legs and underarms) for at least 48 hours prior to the first CHG shower.  You may shave your face/neck. Please follow these instructions carefully:  1.  Shower with CHG Soap the night before surgery and the  morning of Surgery.  2.  If you choose to wash your hair, wash your hair first as usual with your  normal  shampoo.  3.  After you shampoo, rinse your hair and body thoroughly to remove the  shampoo.                            4.  Use CHG as you would any other liquid soap.  You can apply chg directly  to the skin and wash                       Gently with a scrungie or clean washcloth.  5.  Apply the CHG Soap to your body ONLY FROM THE NECK DOWN.   Do not use on face/ open                           Wound or open sores. Avoid contact with eyes, ears mouth and genitals (private parts).                       Wash face,  Genitals (private parts) with your normal soap.             6.  Wash thoroughly, paying special attention to the area where your surgery  will be performed.  7.  Thoroughly rinse your body with warm water from the neck down.  8.  DO NOT shower/wash with your normal soap after using and rinsing off  the CHG Soap.                9.  Pat yourself dry with a clean towel.            10.  Wear clean pajamas.            11.  Place clean sheets on your bed the night of your first shower and do not  sleep with pets. Day of Surgery : Do not apply any lotions/deodorants the morning of surgery.  Please wear clean clothes to the hospital/surgery center.  FAILURE TO FOLLOW THESE INSTRUCTIONS MAY RESULT IN THE CANCELLATION OF YOUR SURGERY PATIENT SIGNATURE_________________________________  NURSE SIGNATURE__________________________________  ________________________________________________________________________                          QUESTIONS Hansel Feinstein PRE OP NURSE AT [phone]

## 2021-02-12 NOTE — H&P (Signed)
Erica Gordon is a 50 y.o. 50 YO female, P: 2-0-0-2 presents for hysterectomy because of chronic pelvic pain and uterine fibroids. The patient is status post endometrial ablation since 2011 with no concerns though she continued to have some bleeding each month after the procedure. Six months ago she developed, intermittent sharp pelvic pains that would occur most days of the week and last up to 30 minutes. These pains would decrease with Ibuprofen 600 mg but would escalate during her menses. In recent months these pains have begun to occur almost daily though still intermittent. Her menstrual flow lasts for 4 days and requires a pad change 3 times a day. During this time she has noticed that she will have problems completely emptying her bladder. Though she has no dyspareunia, she report excruciating pain with orgasm. She denies any dysuria, urinary frequency, urgency, incontinence or vaginitis symptoms. A pelvic ultrasound in December 2021 revealed an anteverted uterus: 159.9 cc: 6.40 x 7.07 x 6.75 cm, endometrium: 6.3 mm; #5 fibroids: fundal right sub-serosal-2.26 cm, right fundal intramural-2.94 cm, anterior intramural-3.33 cm, posterior left sub-serosal-3.32 cm and left sub-serosal-2.56 cm right ovary- 3.45 cm and left ovary-3.07 cm. She also had a abdominal/ pelvic MRI at the same time as a follow up to elevated androgens which corresponded to ultrasound findings though her left ovary and left adrenal gland was not visible. Given the chronicity and disruptive nature of her symptoms, the patient, after a review of medical and surgical management options, has decided to proceed with a hysterectomy.  Past Medical History   OB History: G: 2;  P: 2-0-0-2;  SVB: 1998 and 2000, largest infant: 8 lbs. 13 oz.   GYN History: menarche: 50 YO;  LMP: 12/20/2020;  Contraception: Vasectomy;  Denies history of abnormal PAP smear.  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   ROS: Admits to glasses/contact lenses, (past month) hot flashes and night sweats and monthly migraines but denies  vision changes, nasal congestion, dysphagia, tinnitus, dizziness, hoarseness, cough,  chest pain, shortness of breath, nausea, vomiting, diarrhea,constipation,  urinary frequency, urgency  dysuria, hematuria, vaginitis symptoms, pelvic pain, swelling of joints,easy bruising,  myalgias, arthralgias, skin rashes, unexplained weight loss and except as is mentioned in the history of present illness, patient's review of systems is otherwise negative.     Physical Exam  Bp: 98/62;  P: 72 bpm;  O2Sat. 100% (room air);  Weight: 145 lbs.;  Height: 5'7";  BMI: 22.7  Neck: supple without masses or thyromegaly Lungs: clear to auscultation Heart: regular rate and rhythm Abdomen: soft, diffusely  tender without guarding  and no organomegaly Pelvic:EGBUS- wnl; vagina-normal rugae; uterus-normal size with mild tenderness,  cervix without lesions or motion tenderness; adnexae-no tenderness or masses Extremities:  no clubbing, cyanosis or edema   Assesment: Chronic Pelvic Pain                        Uterine Fibroids                       Hyperandrogenemia   Disposition: A discussion was held with patient regarding the indication for her procedure(s) along with risks and benefits.    The robot assisted hysterectomy benefits include but are not limited to: less post-operative pain, less blood loss during surgery, reduced risk of injury to other organs due to better visualization with a 3-D HD 10 times magnifying camera, shorter hospital stay between 0-1 night and rapid recovery with return to daily routine in 2-3 weeks. Although robot assisted hysterectomy has a longer operative time than traditional laparotomy, the benefits usually outweigh the risks in a patient with good medical history.      Risks include, but are not limited to: reaction to anesthesia, bleeding, infection, injury to other organs, need for laparotomy, transient post-operative facial edema, increased risk of pelvic prolapse- associated with any hysterectomy as well as earlier onset of menopause. Preservation or preventative removal of the ovaries was also reviewed and left to the patient's discretion.    The patient verbalized understanding of these risks and has consented to proceed with a Robot Assisted Total Laparoscopic Hysterectomy with Bilateral Salpingectomy and Possible Left Oophorectomy at [facility] on February 16, 2021 at 1 p.m.    CSN# [id number]   [place] J. Florene Glen, PA-C  for Dr. Dede Query. [place]

## 2021-02-13 ENCOUNTER — Encounter (HOSPITAL_COMMUNITY)
Admission: RE | Admit: 2021-02-13 | Discharge: 2021-02-13 | Disposition: A | Discharge status: Home / Self Care | Payer: No Typology Code available for payment source | Source: Ambulatory Visit | Attending: Obstetrics and Gynecology | Admitting: Obstetrics and Gynecology

## 2021-02-13 ENCOUNTER — Other Ambulatory Visit: Payer: Self-pay

## 2021-02-13 DIAGNOSIS — Z01812 Encounter for preprocedural laboratory examination: Secondary | ICD-10-CM | POA: Diagnosis present

## 2021-02-13 LAB — BASIC METABOLIC PANEL
Anion gap: 9 (ref 5–15)
BUN: 24 mg/dL — ABNORMAL HIGH (ref 6–20)
CO2: 26 mmol/L (ref 22–32)
Calcium: 9.4 mg/dL (ref 8.9–10.3)
Chloride: 103 mmol/L (ref 98–111)
Creatinine, Ser: 0.81 mg/dL (ref 0.44–1.00)
GFR, Estimated: >60 mL/min (ref >60)
Glucose, Bld: 106 mg/dL — ABNORMAL HIGH (ref 70–99)
Potassium: 4.1 mmol/L (ref 3.5–5.1)
Sodium: 138 mmol/L (ref 135–145)

## 2021-02-13 LAB — CBC
HCT: 39.7 % (ref 36.0–46.0)
Hemoglobin: 13.6 g/dL (ref 12.0–15.0)
MCH: 32.1 pg (ref 26.0–34.0)
MCHC: 34.3 g/dL (ref 30.0–36.0)
MCV: 93.6 fL (ref 80.0–100.0)
Platelets: 224 10*3/uL (ref 150–400)
RBC: 4.24 MIL/uL (ref 3.87–5.11)
RDW: 12.3 % (ref 11.5–15.5)
WBC: 7.2 10*3/uL (ref 4.0–10.5)
nRBC: 0 % (ref 0.0–0.2)

## 2021-02-16 ENCOUNTER — Encounter (HOSPITAL_BASED_OUTPATIENT_CLINIC_OR_DEPARTMENT_OTHER): Payer: Self-pay | Admitting: Obstetrics and Gynecology

## 2021-02-16 ENCOUNTER — Ambulatory Visit (HOSPITAL_BASED_OUTPATIENT_CLINIC_OR_DEPARTMENT_OTHER): Payer: No Typology Code available for payment source | Admitting: Certified Registered Nurse Anesthetist

## 2021-02-16 ENCOUNTER — Ambulatory Visit (HOSPITAL_BASED_OUTPATIENT_CLINIC_OR_DEPARTMENT_OTHER)
Admission: RE | Admit: 2021-02-16 | Discharge: 2021-02-16 | Disposition: A | Discharge status: Home / Self Care | Payer: No Typology Code available for payment source | Attending: Obstetrics and Gynecology | Admitting: Obstetrics and Gynecology

## 2021-02-16 ENCOUNTER — Other Ambulatory Visit: Payer: Self-pay

## 2021-02-16 ENCOUNTER — Encounter (HOSPITAL_BASED_OUTPATIENT_CLINIC_OR_DEPARTMENT_OTHER)
Admission: RE | Disposition: A | Discharge status: Home / Self Care | Payer: Self-pay | Source: Home / Self Care | Attending: Obstetrics and Gynecology

## 2021-02-16 DIAGNOSIS — G8929 Other chronic pain: Secondary | ICD-10-CM | POA: Insufficient documentation

## 2021-02-16 DIAGNOSIS — D251 Intramural leiomyoma of uterus: Secondary | ICD-10-CM | POA: Insufficient documentation

## 2021-02-16 DIAGNOSIS — Z881 Allergy status to other antibiotic agents status: Secondary | ICD-10-CM | POA: Insufficient documentation

## 2021-02-16 DIAGNOSIS — R102 Pelvic and perineal pain unspecified side: Secondary | ICD-10-CM

## 2021-02-16 DIAGNOSIS — D219 Benign neoplasm of connective and other soft tissue, unspecified: Secondary | ICD-10-CM

## 2021-02-16 DIAGNOSIS — N72 Inflammatory disease of cervix uteri: Secondary | ICD-10-CM | POA: Insufficient documentation

## 2021-02-16 DIAGNOSIS — E281 Androgen excess: Secondary | ICD-10-CM | POA: Diagnosis not present

## 2021-02-16 HISTORY — PX: ROBOTIC ASSISTED LAPAROSCOPIC HYSTERECTOMY AND SALPINGECTOMY: SHX6379

## 2021-02-16 HISTORY — DX: Personal history of other diseases of the digestive system: Z87.19

## 2021-02-16 HISTORY — DX: Gastro-esophageal reflux disease without esophagitis: K21.9

## 2021-02-16 HISTORY — PX: ABDOMINAL HYSTERECTOMY: SHX81

## 2021-02-16 LAB — TYPE AND SCREEN
ABO/RH(D): A POS
Antibody Screen: NEGATIVE

## 2021-02-16 LAB — POCT PREGNANCY, URINE: Preg Test, Ur: NEGATIVE

## 2021-02-16 SURGERY — XI ROBOTIC ASSISTED LAPAROSCOPIC HYSTERECTOMY AND SALPINGECTOMY
Anesthesia: General | Site: Abdomen | Laterality: Left

## 2021-02-16 MED ORDER — MIDAZOLAM HCL 2 MG/2ML IJ SOLN
INTRAMUSCULAR | Status: DC | PRN
  Administered 2021-02-16: 2 mg via INTRAVENOUS

## 2021-02-16 MED ORDER — SIMETHICONE 80 MG PO CHEW: 80.0000 mg | CHEWABLE_TABLET | Freq: Four times a day (QID) | ORAL | Status: DC | PRN

## 2021-02-16 MED ORDER — LACTATED RINGERS IV SOLN: INTRAVENOUS | Status: DC

## 2021-02-16 MED ORDER — OXYCODONE HCL 5 MG PO TABS
5.0000 mg | ORAL_TABLET | Freq: Once | ORAL | Status: AC | PRN
  Administered 2021-02-16: 5 mg via ORAL

## 2021-02-16 MED ORDER — EPHEDRINE SULFATE-NACL 50-0.9 MG/10ML-% IV SOSY
PREFILLED_SYRINGE | INTRAVENOUS | Status: DC | PRN
  Administered 2021-02-16: 10 mg via INTRAVENOUS

## 2021-02-16 MED ORDER — PANTOPRAZOLE SODIUM 40 MG PO TBEC
DELAYED_RELEASE_TABLET | ORAL | Status: AC
  Filled 2021-02-16: qty 1

## 2021-02-16 MED ORDER — PROPOFOL 10 MG/ML IV BOLUS
INTRAVENOUS | Status: DC | PRN
  Administered 2021-02-16: 140 mg via INTRAVENOUS

## 2021-02-16 MED ORDER — ONDANSETRON HCL 4 MG/2ML IJ SOLN
INTRAMUSCULAR | Status: DC | PRN
  Administered 2021-02-16: 4 mg via INTRAVENOUS

## 2021-02-16 MED ORDER — DEXAMETHASONE SODIUM PHOSPHATE 10 MG/ML IJ SOLN
INTRAMUSCULAR | Status: AC
  Filled 2021-02-16: qty 1

## 2021-02-16 MED ORDER — SODIUM CHLORIDE 0.9 % IR SOLN
Status: DC | PRN
  Administered 2021-02-16: 1000 mL

## 2021-02-16 MED ORDER — ROCURONIUM BROMIDE 10 MG/ML (PF) SYRINGE
PREFILLED_SYRINGE | INTRAVENOUS | Status: DC | PRN
  Administered 2021-02-16: 30 mg via INTRAVENOUS
  Administered 2021-02-16: 70 mg via INTRAVENOUS
  Administered 2021-02-16: 30 mg via INTRAVENOUS

## 2021-02-16 MED ORDER — SUGAMMADEX SODIUM 200 MG/2ML IV SOLN
INTRAVENOUS | Status: DC | PRN
  Administered 2021-02-16: 200 mg via INTRAVENOUS

## 2021-02-16 MED ORDER — ROPIVACAINE HCL 5 MG/ML IJ SOLN
INTRAMUSCULAR | Status: DC | PRN
  Administered 2021-02-16: 50 mL

## 2021-02-16 MED ORDER — OXYCODONE HCL 5 MG/5ML PO SOLN: 5.0000 mg | Freq: Once | ORAL | Status: AC | PRN

## 2021-02-16 MED ORDER — LIDOCAINE 2% (20 MG/ML) 5 ML SYRINGE
INTRAMUSCULAR | Status: DC | PRN
  Administered 2021-02-16: 60 mg via INTRAVENOUS

## 2021-02-16 MED ORDER — SODIUM CHLORIDE (PF) 0.9 % IJ SOLN
INTRAMUSCULAR | Status: DC | PRN
  Administered 2021-02-16: 50 mL

## 2021-02-16 MED ORDER — AMISULPRIDE (ANTIEMETIC) 5 MG/2ML IV SOLN: 10.0000 mg | Freq: Once | INTRAVENOUS | Status: DC | PRN

## 2021-02-16 MED ORDER — ROCURONIUM BROMIDE 10 MG/ML (PF) SYRINGE
PREFILLED_SYRINGE | INTRAVENOUS | Status: AC
  Filled 2021-02-16: qty 10

## 2021-02-16 MED ORDER — KETOROLAC TROMETHAMINE 30 MG/ML IJ SOLN
INTRAMUSCULAR | Status: AC
  Filled 2021-02-16: qty 1

## 2021-02-16 MED ORDER — ACETAMINOPHEN 500 MG PO TABS
1000.0000 mg | ORAL_TABLET | Freq: Four times a day (QID) | ORAL | Status: DC
  Administered 2021-02-16: 1000 mg via ORAL

## 2021-02-16 MED ORDER — GABAPENTIN 300 MG PO CAPS
ORAL_CAPSULE | ORAL | Status: AC
  Filled 2021-02-16: qty 1

## 2021-02-16 MED ORDER — EPHEDRINE 5 MG/ML INJ
INTRAVENOUS | Status: AC
  Filled 2021-02-16: qty 10

## 2021-02-16 MED ORDER — HYDROMORPHONE HCL 1 MG/ML IJ SOLN
INTRAMUSCULAR | Status: AC
  Filled 2021-02-16: qty 1

## 2021-02-16 MED ORDER — SCOPOLAMINE 1 MG/3DAYS TD PT72
1.0000 | MEDICATED_PATCH | TRANSDERMAL | Status: DC
  Administered 2021-02-16: 1.5 mg via TRANSDERMAL

## 2021-02-16 MED ORDER — ONDANSETRON HCL 4 MG PO TABS: 4.0000 mg | ORAL_TABLET | Freq: Four times a day (QID) | ORAL | Status: DC | PRN

## 2021-02-16 MED ORDER — ACETAMINOPHEN 500 MG PO TABS
ORAL_TABLET | ORAL | Status: AC
  Filled 2021-02-16: qty 2

## 2021-02-16 MED ORDER — MENTHOL 3 MG MT LOZG: 1.0000 | LOZENGE | OROMUCOSAL | Status: DC | PRN

## 2021-02-16 MED ORDER — CELECOXIB 200 MG PO CAPS
ORAL_CAPSULE | ORAL | Status: AC
  Filled 2021-02-16: qty 2

## 2021-02-16 MED ORDER — HYDROMORPHONE HCL 1 MG/ML IJ SOLN
0.2500 mg | INTRAMUSCULAR | Status: DC | PRN
Start: 2021-02-16 — End: 2021-02-17
  Administered 2021-02-16 (×2): 0.25 mg via INTRAVENOUS

## 2021-02-16 MED ORDER — SCOPOLAMINE 1 MG/3DAYS TD PT72
MEDICATED_PATCH | TRANSDERMAL | Status: AC
  Filled 2021-02-16: qty 1

## 2021-02-16 MED ORDER — MIDAZOLAM HCL 2 MG/2ML IJ SOLN
INTRAMUSCULAR | Status: AC
  Filled 2021-02-16: qty 2

## 2021-02-16 MED ORDER — CEFAZOLIN SODIUM-DEXTROSE 2-4 GM/100ML-% IV SOLN
2.0000 g | INTRAVENOUS | Status: AC
  Administered 2021-02-16: 2 g via INTRAVENOUS

## 2021-02-16 MED ORDER — FENTANYL CITRATE (PF) 250 MCG/5ML IJ SOLN
INTRAMUSCULAR | Status: AC
  Filled 2021-02-16: qty 5

## 2021-02-16 MED ORDER — DEXAMETHASONE SODIUM PHOSPHATE 10 MG/ML IJ SOLN
INTRAMUSCULAR | Status: DC | PRN
  Administered 2021-02-16: 10 mg via INTRAVENOUS

## 2021-02-16 MED ORDER — LACTATED RINGERS IV SOLN
INTRAVENOUS | Status: DC
Start: 2021-02-16 — End: 2021-02-17

## 2021-02-16 MED ORDER — PANTOPRAZOLE SODIUM 40 MG PO TBEC
40.0000 mg | DELAYED_RELEASE_TABLET | Freq: Every day | ORAL | Status: DC
  Administered 2021-02-16: 40 mg via ORAL

## 2021-02-16 MED ORDER — IBUPROFEN 600 MG PO TABS: ORAL_TABLET | ORAL | 1 refills | Status: DC

## 2021-02-16 MED ORDER — FENTANYL CITRATE (PF) 100 MCG/2ML IJ SOLN
INTRAMUSCULAR | Status: AC
  Filled 2021-02-16: qty 2

## 2021-02-16 MED ORDER — ENSURE PRE-SURGERY PO LIQD: 296.0000 mL | Freq: Once | ORAL | Status: DC

## 2021-02-16 MED ORDER — KETOROLAC TROMETHAMINE 30 MG/ML IJ SOLN
30.0000 mg | Freq: Once | INTRAMUSCULAR | Status: AC
  Administered 2021-02-16: 30 mg via INTRAVENOUS

## 2021-02-16 MED ORDER — CEFAZOLIN SODIUM-DEXTROSE 2-4 GM/100ML-% IV SOLN
INTRAVENOUS | Status: AC
  Filled 2021-02-16: qty 100

## 2021-02-16 MED ORDER — ACETAMINOPHEN 500 MG PO TABS
1000.0000 mg | ORAL_TABLET | ORAL | Status: AC
  Administered 2021-02-16: 1000 mg via ORAL

## 2021-02-16 MED ORDER — ONDANSETRON HCL 4 MG/2ML IJ SOLN: 4.0000 mg | Freq: Four times a day (QID) | INTRAMUSCULAR | Status: DC | PRN

## 2021-02-16 MED ORDER — ONDANSETRON HCL 4 MG/2ML IJ SOLN
INTRAMUSCULAR | Status: AC
  Filled 2021-02-16: qty 2

## 2021-02-16 MED ORDER — FENTANYL CITRATE (PF) 100 MCG/2ML IJ SOLN
INTRAMUSCULAR | Status: DC | PRN
  Administered 2021-02-16: 100 ug via INTRAVENOUS
  Administered 2021-02-16 (×5): 50 ug via INTRAVENOUS

## 2021-02-16 MED ORDER — DOCUSATE SODIUM 100 MG PO CAPS
ORAL_CAPSULE | ORAL | Status: AC
  Filled 2021-02-16: qty 1

## 2021-02-16 MED ORDER — IBUPROFEN 200 MG PO TABS: 600.0000 mg | ORAL_TABLET | Freq: Four times a day (QID) | ORAL | Status: DC

## 2021-02-16 MED ORDER — DOCUSATE SODIUM 100 MG PO CAPS
100.0000 mg | ORAL_CAPSULE | Freq: Two times a day (BID) | ORAL | Status: DC
  Administered 2021-02-16: 100 mg via ORAL

## 2021-02-16 MED ORDER — GABAPENTIN 300 MG PO CAPS
300.0000 mg | ORAL_CAPSULE | ORAL | Status: AC
  Administered 2021-02-16: 300 mg via ORAL

## 2021-02-16 MED ORDER — POVIDONE-IODINE 10 % EX SWAB
2.0000 "application " | Freq: Once | CUTANEOUS | Status: AC
  Administered 2021-02-16: 2 via TOPICAL

## 2021-02-16 MED ORDER — LIDOCAINE HCL (PF) 2 % IJ SOLN
INTRAMUSCULAR | Status: AC
  Filled 2021-02-16: qty 5

## 2021-02-16 MED ORDER — ONDANSETRON HCL 4 MG/2ML IJ SOLN: 4.0000 mg | Freq: Once | INTRAMUSCULAR | Status: DC | PRN

## 2021-02-16 MED ORDER — CELECOXIB 200 MG PO CAPS
400.0000 mg | ORAL_CAPSULE | ORAL | Status: AC
  Administered 2021-02-16: 400 mg via ORAL

## 2021-02-16 MED ORDER — OXYCODONE HCL 5 MG PO TABS
ORAL_TABLET | ORAL | Status: AC
  Filled 2021-02-16: qty 1

## 2021-02-16 SURGICAL SUPPLY — 66 items
ADH SKN CLS APL DERMABOND .7 (GAUZE/BANDAGES/DRESSINGS) ×2
BAG DECANTER FOR FLEXI CONT (MISCELLANEOUS) ×3 IMPLANT
BARRIER ADHS 3X4 INTERCEED (GAUZE/BANDAGES/DRESSINGS) ×3 IMPLANT
BRR ADH 4X3 ABS CNTRL BYND (GAUZE/BANDAGES/DRESSINGS) ×2
CATH FOLEY 3WAY  5CC 16FR (CATHETERS) ×3
CATH FOLEY 3WAY 5CC 16FR (CATHETERS) ×2 IMPLANT
COVER BACK TABLE 60X90IN (DRAPES) ×3 IMPLANT
COVER TIP SHEARS 8 DVNC (MISCELLANEOUS) ×2 IMPLANT
COVER TIP SHEARS 8MM DA VINCI (MISCELLANEOUS) ×3
COVER WAND RF STERILE (DRAPES) ×3 IMPLANT
DECANTER SPIKE VIAL GLASS SM (MISCELLANEOUS) ×9 IMPLANT
DEFOGGER SCOPE WARMER CLEARIFY (MISCELLANEOUS) ×3 IMPLANT
DERMABOND ADVANCED (GAUZE/BANDAGES/DRESSINGS) ×1
DERMABOND ADVANCED .7 DNX12 (GAUZE/BANDAGES/DRESSINGS) ×2 IMPLANT
DRAPE ARM DVNC X/XI (DISPOSABLE) ×8 IMPLANT
DRAPE COLUMN DVNC XI (DISPOSABLE) ×2 IMPLANT
DRAPE DA VINCI XI ARM (DISPOSABLE) ×12
DRAPE DA VINCI XI COLUMN (DISPOSABLE) ×3
DRAPE UTILITY XL STRL (DRAPES) ×3 IMPLANT
DURAPREP 26ML APPLICATOR (WOUND CARE) ×3 IMPLANT
ELECT REM PT RETURN 9FT ADLT (ELECTROSURGICAL) ×3
ELECTRODE REM PT RTRN 9FT ADLT (ELECTROSURGICAL) ×2 IMPLANT
GAUZE 4X4 16PLY RFD (DISPOSABLE) ×3 IMPLANT
GLOVE SURG ENC MOIS LTX SZ7 (GLOVE) ×3 IMPLANT
GLOVE SURG LTX SZ6.5 (GLOVE) ×9 IMPLANT
GLOVE SURG LTX SZ7.5 (GLOVE) ×9 IMPLANT
GLOVE SURG UNDER POLY LF SZ6.5 (GLOVE) ×6 IMPLANT
GLOVE SURG UNDER POLY LF SZ7 (GLOVE) ×24 IMPLANT
IRRIG SUCT STRYKERFLOW 2 WTIP (MISCELLANEOUS) ×3
IRRIGATION SUCT STRKRFLW 2 WTP (MISCELLANEOUS) ×2 IMPLANT
IV NS 1000ML (IV SOLUTION) ×6
IV NS 1000ML BAXH (IV SOLUTION) ×4 IMPLANT
KIT TURNOVER CYSTO (KITS) ×3 IMPLANT
LEGGING LITHOTOMY PAIR STRL (DRAPES) ×3 IMPLANT
MANIFOLD NEPTUNE II (INSTRUMENTS) ×3 IMPLANT
MANIPULATOR UTERINE 4.5 ZUMI (MISCELLANEOUS) ×3 IMPLANT
NEEDLE HYPO 22GX1.5 SAFETY (NEEDLE) ×3 IMPLANT
OBTURATOR OPTICAL STANDARD 8MM (TROCAR) ×3
OBTURATOR OPTICAL STND 8 DVNC (TROCAR) ×2
OBTURATOR OPTICALSTD 8 DVNC (TROCAR) ×2 IMPLANT
OCCLUDER COLPOPNEUMO (BALLOONS) ×3 IMPLANT
PACK ROBOT WH (CUSTOM PROCEDURE TRAY) ×3 IMPLANT
PACK ROBOTIC GOWN (GOWN DISPOSABLE) ×3 IMPLANT
PACK TRENDGUARD 450 HYBRID PRO (MISCELLANEOUS) ×2 IMPLANT
PAD OB MATERNITY 4.3X12.25 (PERSONAL CARE ITEMS) ×3 IMPLANT
PAD PREP 24X48 CUFFED NSTRL (MISCELLANEOUS) ×3 IMPLANT
PROTECTOR NERVE ULNAR (MISCELLANEOUS) ×6 IMPLANT
SEAL CANN UNIV 5-8 DVNC XI (MISCELLANEOUS) ×8 IMPLANT
SEAL XI 5MM-8MM UNIVERSAL (MISCELLANEOUS) ×12
SEALER VESSEL DA VINCI XI (MISCELLANEOUS) ×3
SEALER VESSEL EXT DVNC XI (MISCELLANEOUS) ×2 IMPLANT
SET IRRIG Y TYPE TUR BLADDER L (SET/KITS/TRAYS/PACK) ×3 IMPLANT
SET TRI-LUMEN FLTR TB AIRSEAL (TUBING) ×3 IMPLANT
SOL PREP PROV IODINE SCRUB 4OZ (MISCELLANEOUS) ×3 IMPLANT
STRIP CLOSURE SKIN 1/4X4 (GAUZE/BANDAGES/DRESSINGS) ×3 IMPLANT
SUT MNCRL AB 3-0 PS2 27 (SUTURE) ×6 IMPLANT
SUT VIC AB 0 CT1 27 (SUTURE) ×3
SUT VIC AB 0 CT1 27XBRD ANBCTR (SUTURE) ×2 IMPLANT
SUT VIC AB 2-0 UR6 27 (SUTURE) ×3 IMPLANT
SUT VICRYL 0 UR6 27IN ABS (SUTURE) ×3 IMPLANT
SUT VLOC 180 0 9IN  GS21 (SUTURE) ×6
SUT VLOC 180 0 9IN GS21 (SUTURE) ×4 IMPLANT
TOWEL OR 17X26 10 PK STRL BLUE (TOWEL DISPOSABLE) ×3 IMPLANT
TRENDGUARD 450 HYBRID PRO PACK (MISCELLANEOUS) ×3
TROCAR PORT AIRSEAL 8X120 (TROCAR) ×3 IMPLANT
WATER STERILE IRR 500ML POUR (IV SOLUTION) ×3 IMPLANT

## 2021-02-16 NOTE — Transfer of Care (Signed)
Immediate Anesthesia Transfer of Care Note  Patient: Erica Gordon  Procedure(s) Performed: XI ROBOTIC ASSISTED LAPAROSCOPIC HYSTERECTOMY AND SALPINGECTOMY (Bilateral Abdomen)  Patient Location: PACU  Anesthesia Type:General  Level of Consciousness: awake, alert  and oriented  Airway & Oxygen Therapy: Patient Spontanous Breathing and Patient connected to face mask oxygen  Post-op Assessment: Report given to RN and Post -op Vital signs reviewed and stable  Post vital signs: Reviewed and stable  Last Vitals:  Vitals Value Taken Time  BP 133/82 02/16/21 1453  Temp    Pulse 75 02/16/21 1457  Resp 12 02/16/21 1457  SpO2 100 % 02/16/21 1457  Vitals shown include unvalidated device data.  Last Pain:  Vitals:   02/16/21 1059  TempSrc: Oral  PainSc: 0-No pain         Complications: No complications documented.

## 2021-02-16 NOTE — Op Note (Signed)
Preoperative diagnosis: Chronic pelvic pain, uterine fibroids, hyperandrognism  Postoperative diagnosis: Same   Anesthesia: General  Anesthesiologist: Dr. Royce Macadamia  Procedure: Robotically assisted total hysterectomy with bilateral salpingectomy  Surgeon: Dr. Katharine Look Rivard  Assistant: Earnstine Regal P.A.-C.  Estimated blood loss: 50 cc  Procedure:  After being informed of the planned procedure with possible complications including but not limited to bleeding, infection, injury to other organs, need for laparotomy, expected hospital stay and recovery, informed consent is obtained and patient is taken to or #5. She is placed in  lithotomy position on Trengard with both arms padded and tucked on each side and bilateral knee-high sequential compressive devices. She is given general anesthesia with endotracheal intubation without any complication. She is prepped and draped in a sterile fashion. A three-way Foley catheter is inserted in her bladder.  Pelvic exam reveals: anteverted 8 week size uterus with 2 normal ovaries.  A weighted speculum is inserted in the vagina and the anterior lip of the cervix is grasped with a tenaculum forcep. We proceed with a paracervical block and vaginal infiltration using ropivacaine 0.5% diluted 1 in 1 with saline. The uterus was then sounded at 6 cm. We easily dilate the cervix using Hegar dilator to  #27 which allows for difficult  placement of the intrauterine ZUMI manipulator with a 3.0 KOH ring. The ring is sutured to the cervix with 0 Vicryl.  Trocar placement is decided. We infiltrate at the umbilicus with 10 cc of ropivacaine per protocol and perform a 10 mm semi-elleptical incision which is brought down bluntly to the fascia. The fascia is identified and grasped with Coker forceps. The fascia is incised with Mayo scissors. Peritoneum is entered bluntly. A pursestring suture of 0 Vicryl is placed on the fascia and a 10 mm Hassan trocar is easily inserted in  the abdominal cavity held in placed with a Purstring suture. This allows for easy insufflation of a pneumoperitoneum using warmed CO2 at a maximum pressure of 15 mm of mercury. 60 cc of Ropivacaine 0.5 % diluted 1 in 1 is sent in the pelvis and the patient is positioned in reverse Trendelenburg. We then placed two 7mm robotic trocar on the left, one 30mm robotic trocar on the right and one 10 mm patient's side assistant trocar on the right  after infiltrating every site  with ropivacaine per protocol. The robot is docked on the right of the patient after positioning her in Trendelenburg. A Vessel Seal is inserted in arm #4, a Long bipolar forceps is inserted in arm #2 and a Prograsp is inserted in arm #1.  Preparation and docking is completed in 56 minutes.  Observation: uterus is with a large 5 cm posterior pedunculated fibroid, both tubes and ovaries are normal. Liver and appendix are normal.  We start on the right side by sealing and cutting the mesosalpynx , the right utero-ovarian ligament and the right round ligament .  This gives [place] entry into the retroperitoneal space with an easy dissection of the anterior broad ligament. The ligament was opened all the way to the left round ligament.   We then proceed with systematic dissection of the bladder from the anterior vaginal cuff which is easily identified with the KOH ring. The plane of dissection is confirmed with filling the bladder with 200 cc of saline. We are able to dissect the bladder 2 cm below the KOH ring. We then opened the posterior right broad ligament all the way to the posterior KOH ring after identifying  the full course of the right ureter.   Moving to the left side we Seal and cut  the left round ligament , the left utero-ovarian ligament and  the mesosalpinx in between. Entry into the retroperitoneal space allows [place] to complete dissection of the bladder on the left side and skeletonized the uterine vessels. The left broad ligament is  then dissected all the way to the posterior KOH ring after identifying the full course of the left ureter.  Both tubes are removed from the pelvic cavity through the assistant's trocar.   With pressure on the KOH ring and the bladder fully dissected, we cauterize and cut the uterine vessels on both sides at the level of the KOH ring.  The vaginal occluder is inflated and we proceed with a 360 colpotomy using an open monopolar scissors and freeing the uterus entirely.  The uterus is delivered vaginally with traction. The vaginal occluder is inserted in the vagina to maintain pneumoperitoneum.  Instruments are then modified for a suture cut in arm #4 and a long tip forcep in arm #2. We proceed with closure of the vaginal cuff with 2 running sutures of 0 V-Lock. We irrigated profusely with warm saline and confirm a satisfactory hemostasis as well as 2 normal ureters with good mobility and no dilatation.  A sheet of Interceed , divided in 2, is placed on the vaginal cuff and behind the ovaries.  All instruments are then removed and the robot is undocked. Console time: 1 hour and 13 minutes.  All trochars are removed under direct visualization after evacuating the pneumoperitoneum.  The fascia of the umbilical incision is closed with the previously placed pursestring suture of 0 Vicryl. The fascia of the 10 mm assistant trocar is closed with a figure-of-eight suture of 0 Vicryl.  All incisions are then closed with subcuticular suture of 3-0 Monocryl and Dermabond.  A speculum is inserted in the vagina to confirm a adequate closure of the vaginal cuff and good hemostasis.  Instrument and sponge count is complete x2. The procedure is well tolerated by the patient is taken to recovery room in a well and stable condition.  Dr Cletis Media was present and scrubbed at all times. Surgical assistance was required due to the complexity of the anatomy and the robotic approach of the procedure.   Estimated  blood loss: 50 cc  Specimen: Uterus and tubes weighing 177 g sent to pathology

## 2021-02-16 NOTE — Discharge Instructions (Signed)
Call [facility] @ [phone] if:  You have a temperature greater than or equal to 100.4 degrees Farenheit orally You have pain that is not made better by the pain medication given and taken as directed You have excessive bleeding or problems urinating  Take Colace (Docusate Sodium/Stool Softener) 100 mg 2-3 times daily while taking narcotic pain medicine to avoid constipation or until bowel movements are regular. Take Ibuprofen 600 mg with food every 6 hours for 5 days then as needed for pain Take Acetaminophen 500 mg  (#2 tablets) every 6 hours for 5 days then as needed for pain  You may take oxycodone 5 mg every 6 hours as needed for breakthrough pain  You may drive after 1 week You may walk up steps  You may shower tomorrow You may resume a regular diet  Keep incisions clean and dry Do not lift over 15 pounds nor walk your pet for 6 weeks  Avoid anything in vagina for 6 weeks (or until after your post-operative visit)

## 2021-02-16 NOTE — Anesthesia Preprocedure Evaluation (Addendum)
Anesthesia Evaluation  Patient identified by MRN, date of birth, ID band Patient awake    Reviewed: Allergy & Precautions, NPO status , Patient's Chart, lab work & pertinent test results  Airway Mallampati: II  TM Distance: >3 FB Neck ROM: Full    Dental no notable dental hx. (+) Teeth Intact   Pulmonary  Covid-19 01/17/21- asymptomatic   Pulmonary exam normal breath sounds clear to auscultation       Cardiovascular negative cardio ROS Normal cardiovascular exam Rhythm:Regular Rate:Normal     Neuro/Psych  Headaches, negative psych ROS   GI/Hepatic Neg liver ROS, hiatal hernia, GERD  Medicated and Controlled,Hx/o Gilbert's syndrome- asymptomatic, normal Bilirubin   Endo/Other  Hyperlipidemia- on no Rx  Renal/GU negative Renal ROS  negative genitourinary   Musculoskeletal negative musculoskeletal ROS (+)   Abdominal   Peds  Hematology negative hematology ROS (+)   Anesthesia Other Findings   Reproductive/Obstetrics Uterine fibroids Pelvic pain                            Anesthesia Physical Anesthesia Plan  ASA: II  Anesthesia Plan: General   Post-op Pain Management:    Induction: Intravenous  PONV Risk Score and Plan: 4 or greater and Scopolamine patch - Pre-op, Ondansetron, Dexamethasone, Treatment may vary due to age or medical condition and Amisulpride  Airway Management Planned: Oral ETT  Additional Equipment:   Intra-op Plan:   Post-operative Plan: Extubation in OR  Informed Consent: I have reviewed the patients History and Physical, chart, labs and discussed the procedure including the risks, benefits and alternatives for the proposed anesthesia with the patient or authorized representative who has indicated his/her understanding and acceptance.     Dental advisory given  Plan Discussed with: CRNA and Anesthesiologist  Anesthesia Plan Comments:         Anesthesia Quick Evaluation

## 2021-02-16 NOTE — Anesthesia Procedure Notes (Signed)
Procedure Name: Intubation Date/Time: 02/16/2021 11:49 AM Performed by: Genelle Bal, CRNA Pre-anesthesia Checklist: Patient identified, Emergency Drugs available, Suction available and Patient being monitored Patient Re-evaluated:Patient Re-evaluated prior to induction Oxygen Delivery Method: Circle system utilized Preoxygenation: Pre-oxygenation with 100% oxygen Induction Type: IV induction Ventilation: Mask ventilation without difficulty Laryngoscope Size: Miller and 2 Grade View: Grade I Tube type: Oral Tube size: 7.0 mm Number of attempts: 1 Airway Equipment and Method: Stylet Placement Confirmation: ETT inserted through vocal cords under direct vision,  positive ETCO2 and breath sounds checked- equal and bilateral Secured at: 21 cm Tube secured with: Tape Dental Injury: Teeth and Oropharynx as per pre-operative assessment

## 2021-02-16 NOTE — Anesthesia Postprocedure Evaluation (Signed)
Anesthesia Post Note  Patient: Zariya Minner  Procedure(s) Performed: XI ROBOTIC ASSISTED LAPAROSCOPIC HYSTERECTOMY AND SALPINGECTOMY (Bilateral Abdomen)     Patient location during evaluation: PACU Anesthesia Type: General Level of consciousness: awake and alert and oriented Pain management: pain level controlled Vital Signs Assessment: post-procedure vital signs reviewed and stable Respiratory status: spontaneous breathing, nonlabored ventilation and respiratory function stable Cardiovascular status: blood pressure returned to baseline and stable Postop Assessment: no apparent nausea or vomiting Anesthetic complications: no   No complications documented.  Last Vitals:  Vitals:   02/16/21 1545 02/16/21 1556  BP: 122/82 (!) 86/45  Pulse: 74 (!) 58  Resp: 13 15  Temp: 36.4 C 36.4 C  SpO2: 98% 98%    Last Pain:  Vitals:   02/16/21 1545  TempSrc:   PainSc: 4                  Liliauna Santoni A.

## 2021-02-16 NOTE — Interval H&P Note (Signed)
History and Physical Interval Note:  02/16/2021 11:06 AM  Erica Gordon  has presented today for surgery, with the diagnosis of pelvic pain fibroids.  The various methods of treatment have been discussed with the patient and family. After consideration of risks, benefits and other options for treatment, the patient has consented to  Procedure(s): XI ROBOTIC ASSISTED LAPAROSCOPIC HYSTERECTOMY AND SALPINGECTOMY (Bilateral) And possible ROBOTIC ASSISTED OOPHORECTOMY (Left) if ovarian mass confirmed as a surgical intervention.  The patient's history has been reviewed, patient examined, no change in status, stable for surgery.  I have reviewed the patient's chart and labs.  Questions were answered to the patient's satisfaction.     Katharine Look A Rivard

## 2021-02-17 ENCOUNTER — Encounter (HOSPITAL_BASED_OUTPATIENT_CLINIC_OR_DEPARTMENT_OTHER): Payer: Self-pay | Admitting: Obstetrics and Gynecology

## 2021-02-17 LAB — SURGICAL PATHOLOGY

## 2021-03-22 ENCOUNTER — Other Ambulatory Visit: Payer: Self-pay | Admitting: Obstetrics and Gynecology

## 2021-03-22 ENCOUNTER — Ambulatory Visit
Admission: RE | Admit: 2021-03-22 | Discharge: 2021-03-22 | Disposition: A | Discharge status: Home / Self Care | Payer: No Typology Code available for payment source | Source: Ambulatory Visit | Attending: Obstetrics and Gynecology | Admitting: Obstetrics and Gynecology

## 2021-03-22 DIAGNOSIS — R102 Pelvic and perineal pain: Secondary | ICD-10-CM

## 2021-03-22 MED ORDER — IOPAMIDOL (ISOVUE-300) INJECTION 61%
100.0000 mL | Freq: Once | INTRAVENOUS | Status: AC | PRN
  Administered 2021-03-22: 100 mL via INTRAVENOUS

## 2021-07-13 ENCOUNTER — Encounter: Payer: Self-pay | Admitting: Family Medicine

## 2021-07-13 ENCOUNTER — Ambulatory Visit (INDEPENDENT_AMBULATORY_CARE_PROVIDER_SITE_OTHER): Payer: No Typology Code available for payment source | Admitting: Family Medicine

## 2021-07-13 ENCOUNTER — Other Ambulatory Visit: Payer: Self-pay

## 2021-07-13 VITALS — BP 129/87 | HR 76 | Temp 98.3°F | Ht 66.0 in | Wt 142.6 lb

## 2021-07-13 DIAGNOSIS — E785 Hyperlipidemia, unspecified: Secondary | ICD-10-CM

## 2021-07-13 DIAGNOSIS — K219 Gastro-esophageal reflux disease without esophagitis: Secondary | ICD-10-CM | POA: Diagnosis not present

## 2021-07-13 DIAGNOSIS — Z90710 Acquired absence of both cervix and uterus: Secondary | ICD-10-CM | POA: Insufficient documentation

## 2021-07-13 DIAGNOSIS — Z23 Encounter for immunization: Secondary | ICD-10-CM | POA: Diagnosis not present

## 2021-07-13 DIAGNOSIS — Z0000 Encounter for general adult medical examination without abnormal findings: Secondary | ICD-10-CM | POA: Diagnosis not present

## 2021-07-13 DIAGNOSIS — G43C0 Periodic headache syndromes in child or adult, not intractable: Secondary | ICD-10-CM | POA: Diagnosis not present

## 2021-07-13 LAB — CBC WITH DIFFERENTIAL/PLATELET
Basophils Absolute: 0 10*3/uL (ref 0.0–0.1)
Basophils Relative: 0.7 % (ref 0.0–3.0)
Eosinophils Absolute: 0.1 10*3/uL (ref 0.0–0.7)
Eosinophils Relative: 2.2 % (ref 0.0–5.0)
HCT: 44.9 % (ref 36.0–46.0)
Hemoglobin: 15.1 g/dL — ABNORMAL HIGH (ref 12.0–15.0)
Lymphocytes Relative: 28.4 % (ref 12.0–46.0)
Lymphs Abs: 1.5 10*3/uL (ref 0.7–4.0)
MCHC: 33.8 g/dL (ref 30.0–36.0)
MCV: 92.1 fl (ref 78.0–100.0)
Monocytes Absolute: 0.3 10*3/uL (ref 0.1–1.0)
Monocytes Relative: 5.4 % (ref 3.0–12.0)
Neutro Abs: 3.3 10*3/uL (ref 1.4–7.7)
Neutrophils Relative %: 63.3 % (ref 43.0–77.0)
Platelets: 221 10*3/uL (ref 150.0–400.0)
RBC: 4.87 Mil/uL (ref 3.87–5.11)
RDW: 12.7 % (ref 11.5–15.5)
WBC: 5.2 10*3/uL (ref 4.0–10.5)

## 2021-07-13 LAB — LIPID PANEL
Cholesterol: 254 mg/dL — ABNORMAL HIGH (ref 0–200)
HDL: 83.8 mg/dL (ref >39.00)
LDL Cholesterol: 152 mg/dL — ABNORMAL HIGH (ref 0–99)
NonHDL: 169.74
Total CHOL/HDL Ratio: 3
Triglycerides: 87 mg/dL (ref 0.0–149.0)
VLDL: 17.4 mg/dL (ref 0.0–40.0)

## 2021-07-13 LAB — COMPREHENSIVE METABOLIC PANEL
ALT: 16 U/L (ref 0–35)
AST: 23 U/L (ref 0–37)
Albumin: 4.8 g/dL (ref 3.5–5.2)
Alkaline Phosphatase: 55 U/L (ref 39–117)
BUN: 24 mg/dL — ABNORMAL HIGH (ref 6–23)
CO2: 30 mEq/L (ref 19–32)
Calcium: 10 mg/dL (ref 8.4–10.5)
Chloride: 100 mEq/L (ref 96–112)
Creatinine, Ser: 0.94 mg/dL (ref 0.40–1.20)
GFR: 70.35 mL/min (ref >60.00)
Glucose, Bld: 96 mg/dL (ref 70–99)
Potassium: 4.2 mEq/L (ref 3.5–5.1)
Sodium: 138 mEq/L (ref 135–145)
Total Bilirubin: 0.9 mg/dL (ref 0.2–1.2)
Total Protein: 7.4 g/dL (ref 6.0–8.3)

## 2021-07-13 LAB — TSH: TSH: 1.94 u[IU]/mL (ref 0.35–5.50)

## 2021-07-13 MED ORDER — RIZATRIPTAN BENZOATE 10 MG PO TABS
ORAL_TABLET | ORAL | 11 refills | Status: DC
Start: 2021-07-13 — End: 2024-04-06

## 2021-07-13 NOTE — Progress Notes (Signed)
Phone [phone]   Subjective:  Patient presents today for their annual physical. Chief complaint-noted.   See problem oriented charting- ROS- full  review of systems was completed and negative per full ROS sheet  The following were reviewed and entered/updated in epic: Past Medical History:  Diagnosis Date   COVID 01/17/2021   asymptomatic   Family history of genetic disorder 07/22/2020   Family history of malignant neoplasm of ovary    Family history of prostate cancer 07/22/2020   GERD (gastroesophageal reflux disease)    Rosanna Randy syndrome    noted by Dr. Linna Darner. 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    Priority: Low   Blood in stool 07/19/2010    Priority: Low   IBS 04/21/2010    Priority: Low   Chronic pelvic pain in female 02/16/2021   Pelvic pain 02/16/2021   Genetic testing 08/08/2020   Family history of genetic disorder 07/22/2020   Family history of prostate cancer 07/22/2020   Past Surgical History:  Procedure Laterality Date   ABDOMINAL HYSTERECTOMY  02/16/2021   COLONOSCOPY  09/08/2010, jan 2022   rectal bleeding; "runner's colitis"--[facility]   DILATION AND CURETTAGE OF UTERUS  yrs ago   Dr Raphael Gibney   HYSTEROSCOPY  2011   with polypectomy & ablation   [place]  2011   endometrial    ROBOTIC ASSISTED LAPAROSCOPIC HYSTERECTOMY AND SALPINGECTOMY Bilateral 02/16/2021   Procedure: XI ROBOTIC ASSISTED LAPAROSCOPIC HYSTERECTOMY AND  SALPINGECTOMY;  Surgeon: Delsa Bern, MD;  Location: [facility];  Service: Gynecology;  Laterality: Bilateral;   WISDOM TOOTH EXTRACTION  yrs ago    Family History  Problem Relation Age of Onset   Breast cancer Mother 13   Non-Hodgkin's lymphoma Mother 59   Brain cancer Mother        glioblastoma; dx early 80s   Uterine cancer Maternal Grandmother        dx mid 82s   Hyperlipidemia Paternal Grandfather    Heart disease Paternal Grandfather        pacer   Heart disease Father 52        4 vessel CBAG   Prostate cancer Father 6   Hypothyroidism Father    Colon polyps Father    Hyperthyroidism Sister    Cervical cancer Sister        dx mid 28s   Prostate cancer Brother 52   Ovarian cancer Paternal Aunt 14   Other Paternal Aunt        low penetrance CHEK2 mutation (I157T)   Other Paternal Aunt 3       brain tumor   Stroke Neg Hx    Diabetes Neg Hx    Ulcers Neg Hx    Esophageal cancer Neg Hx    Stomach cancer Neg Hx    Rectal cancer Neg Hx    Colon cancer Neg Hx  Medications- reviewed and updated Current Outpatient Medications  Medication Sig Dispense Refill   Cholecalciferol (VITAMIN D3) 2000 UNITS TABS Take by mouth daily.     ibuprofen (ADVIL) 600 MG tablet take 1 tablet po pc every 6 hours for 5 days (Patient taking differently: As needed.) 30 tablet 1   loratadine (CLARITIN) 10 MG tablet Take 10 mg by mouth daily as needed.     MAGNESIUM PO Take by mouth.     Multiple Vitamin (MULTIVITAMIN) tablet Take 1 tablet by mouth daily.     OMEGA 3 1000 MG CAPS Take by mouth daily.     rizatriptan (MAXALT) 10 MG tablet May repeat in 2 hours if needed 9 tablet 11   No current facility-administered medications for this visit.    Allergies-reviewed and updated Allergies  Allergen Reactions   Biaxin [Clarithromycin] Other (See Comments)    REACTION: METALLIC TASTE    Social History   Social History Narrative   Married 1995. 2 sons- 86 and 91 in  2017.       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 [employer]      Hobbies: running, chasing son to lacrosse tournaments, cooking, hiking, outdoors   Objective  Objective:  BP 129/87   Pulse 76   Temp 98.3 F (36.8 C) (Temporal)   Ht _0  (1.676 m)   Wt 142 lb 9.6 oz (64.7 kg)   LMP 12/20/2020   SpO2 99%   BMI 23.02 kg/m  Gen: NAD, resting comfortably HEENT: Mucous membranes are moist. Oropharynx normal Neck: no thyromegaly CV: RRR no murmurs rubs or gallops Lungs: CTAB no crackles, wheeze, rhonchi Abdomen: soft/nontender/nondistended/normal bowel sounds. No rebound or guarding.  Ext: no edema Skin: warm, dry Neuro: grossly normal, moves all extremities, PERRLA   Assessment and Plan   50 y.o. female presenting for annual physical.  Health Maintenance counseling: 1. Anticipatory guidance: Patient counseled regarding regular dental exams -q6 months, eye exams -yearly,  avoiding smoking and second hand smoke , limiting alcohol to 1 beverage per day-may go 2-3 days without a drink and then may have 1-2 drinks - typically under 7 a week .   2. Risk factor reduction:  Advised patient of need for regular exercise and diet rich and fruits and vegetables to reduce risk of heart attack and stroke. Exercise- exercises daily for most part- running, workout with friends -crossfit like with friends. Diet- she eat sat home during the week and out on the weekends- reasonably healthy diet.  Weight down slightly 3 lbs from last year- BMI ideal Wt Readings from Last 3 Encounters:  07/13/21 142 lb 9.6 oz (64.7 kg)  02/16/21 141 lb 3.2 oz (64 kg)  02/13/21 140 lb (63.5 kg)  3. Immunizations/screenings/ancillary studies- Discussed Shingrix today- patient wants to hold off for now (she may call back for this) - otherwise up-to-date. -had moderna x 2, had covid in January as well Immunization History  Administered Date(s) Administered   Hepatitis A 05/24/2016   Influenza,inj,Quad  PF,6+ Mos 10/03/2017, 09/25/2018, 09/24/2019, 09/28/2020   Influenza,inj,Quad PF,6-35 Mos 09/28/2020   Influenza,inj,quad, With Preservative 09/16/2020   Influenza-Unspecified 09/16/2013, 09/17/2014, 09/18/2016, 09/25/2018   Td 10/22/2006   Tdap 06/30/2010, 07/12/2020   Typhoid Inactivated 05/24/2016   Unspecified SARS-COV-2 Vaccination 01/22/2020, 02/19/2020  4. Cervical cancer screening- 10/16/2018 with a 3-year repeat planned- due 10/16/2021 now with hysterectomy for benign reasons- will defer to GYN- but will discontinue on our list 5. Breast cancer screening-  breast exam  with GYN and mammogram last 11/24/2019 - patient was notified to consider to receiving both exams yearly due to lifetime breast cancer risk >20% due to FHx - positive for familial low penetrance CHEK2 variant. -next will be MRI - had multiple imaging tests that ended up beign benign 6. Colon cancer screening - 12/27/2020 with a 5-year repeat planned 7. Skin cancer screening- continues to follow with Dr. Shepard [facility] office. advised regular sunscreen use. Denies worrisome, changing, or new skin lesions.  8. Birth control/STD check- declines - monogamous and husband has vasectomy and she has hysterectomy 9. Osteoporosis screening at 83- will plan on this. Also takes vitamin D -Never smoker  Status of chronic or acute concerns   #social update- patient lost her mother to glioblastoma over a year ago. Father with dementia in [facility] last year- today reports  he doesn't know who she is anymore- she visits once a week.  -trip to [place] in November planned with husband!   #memory- better since being off omeprazole  # Hospital F/U for XL Robotic Assisted Laparoscopic Hysterectomy and Salpingectomy S:Patient presented to the ED 02/16/2021 for a   Laparoscopic Hysterectomy and Salpingectomy due to chronic pelvic pain likely related to fibroids.  A/P: patient reports doing well since surgery- thrilled she is  feeling better   #patient did have some hormone levels that were slightly off- saw Dr. Chalmers Cater and got a good report.  #hyperlipidemia S: Medication:none - was in mild on 07/12/2020 but was not in range to start statin  Lab Results  Component Value Date   CHOL 228 (H) 07/12/2020   HDL 74 07/12/2020   LDLCALC 137 (H) 07/12/2020   TRIG 71 07/12/2020   CHOLHDL 3.1 07/12/2020   A/P: 10-year ASCVD risk based on last cholesterol and current blood pressure and age only 1.1%-continue to monitor and focus on healthy eating/regular exercise unless risk gets above 7.5% and proceed with next potential steps at that point including potential coronary calcium scoring  # GERD S:Medication: no longer on omeprazole due to concern of memory on medicine. Patient was on a B-12 supplement - opt not to pursue B-12 testing in last year visit. - 2020 patient was experiencing intermittent left-sided sharp chest pain for about 30 minutes- she thought this to be gas related. Reported it would occur very few months on and off but not during exercise. - EKG was reassuring in 2020  A/P: rare chest pain related to certain foods- overall doing well and wants to stay off meds   #Migraines- Maxalt 10 mg as needed- typically around  1-2 times a month with menstruation or right after   # Chek 2 mutation from genetics discussion- we will screen thyroid with tsh "DISCUSSION: The lifetime risk for breast cancer in carriers of the most common CHEK2 mutation (c.1100delC) is 25-37% (compared to 56% to 68% for BRCA). There is a tendency for these breast cancers to occur at younger ages, and women with mutations have an increased risk for cancer in the opposite breast (about 1% per year or 24.1% after 10 years). One study of the low penetrance mutation found in the patient (c.470T>C) in [place] estimated the lifetime risk for breast cancer among carriers to be increased by about 50% over the general population risk (compared to double  the risk for the common mutation). The risk for contralateral breast cancer has not been well studied in women with the c.470T>C mutation.   CHEK2 mutations are associated with other malignancies as  well. Thyroid, bladder, kidney, prostate, colon, and female breast cancer occur more frequently in individuals who carry CHEK2 mutations than in the general population. As with female breast cancer, the rates vary from family to family and more research needs to be done to figure out specific risks. Current estimates include:    Thyroid cancer - lifetime risk of 5% (1.5% population risk)    Prostate cancer - lifetime risk of 30% (15% population risk)    Colon cancer - lifetime risk of 10% (5% population risk)    Female breast cancer - lifetime risk of 1% (0.1% population risk)   Ovarian and uterine cancers have been reported in CHEK2 families but it is currently unknown whether there is an associated increased ovarian or uterine cancer risk."  Recommended follow up: No follow-ups on file.  Lab/Order associations: fasting   ICD-10-CM   1. Preventative health care  Z00.00     2. Hyperlipidemia, unspecified hyperlipidemia type  E78.5     3. Gastroesophageal reflux disease without esophagitis  K21.9       No orders of the defined types were placed in this encounter.  I,Harris Phan,acting as a Education administrator for Garret Reddish, MD.,have documented all relevant documentation on the behalf of Garret Reddish, MD,as directed by  Garret Reddish, MD while in the presence of Garret Reddish, MD.  I, Garret Reddish, MD, have reviewed all documentation for this visit. The documentation on 07/13/21 for the exam, diagnosis, procedures, and orders are all accurate and complete.   Return precautions advised.  Garret Reddish, MD

## 2021-07-13 NOTE — Patient Instructions (Addendum)
Health Maintenance Due  Topic Date Due   Zoster Vaccines- Shingrix (1 of 2) discuss.   She will decline for now but may call back to have this scheduled. Never done   MAMMOGRAM Sign release of information at the check out desk for MAMMOGRAM from your GYN.  11/23/2020   Please stop by lab before you go If you have mychart- we will send your results within 3 business days of [place] receiving them.  If you do not have mychart- we will call you about results within 5 business days of [place] receiving them.  *please also note that you will see labs on mychart as soon as they post. I will later go in and write notes on them- will say "notes from Dr. Yong Channel"  I am very thrilled to hear that you recovered well from your recent surgical procedure!  Please check to see if we have Hepatitis A vaccination available.  Recommended follow up: Return in about 1 year (around 07/13/2022) for physical or sooner if needed.

## 2021-07-13 NOTE — Addendum Note (Signed)
Addended by: Linton Ham on: 07/13/2021 10:25 AM   Modules accepted: Orders

## 2021-07-21 ENCOUNTER — Encounter: Payer: Self-pay | Admitting: Family Medicine

## 2021-11-22 ENCOUNTER — Other Ambulatory Visit: Payer: Self-pay | Admitting: Obstetrics and Gynecology

## 2021-11-22 DIAGNOSIS — R898 Other abnormal findings in specimens from other organs, systems and tissues: Secondary | ICD-10-CM

## 2021-12-06 ENCOUNTER — Ambulatory Visit
Admission: RE | Admit: 2021-12-06 | Discharge: 2021-12-06 | Disposition: A | Discharge status: Home / Self Care | Payer: No Typology Code available for payment source | Source: Ambulatory Visit | Attending: Obstetrics and Gynecology | Admitting: Obstetrics and Gynecology

## 2021-12-06 ENCOUNTER — Other Ambulatory Visit: Payer: Self-pay

## 2021-12-06 DIAGNOSIS — R898 Other abnormal findings in specimens from other organs, systems and tissues: Secondary | ICD-10-CM

## 2021-12-06 MED ORDER — GADOBUTROL 1 MMOL/ML IV SOLN
7.0000 mL | Freq: Once | INTRAVENOUS | Status: AC | PRN
  Administered 2021-12-06: 10:00:00 7 mL via INTRAVENOUS

## 2021-12-14 ENCOUNTER — Other Ambulatory Visit: Payer: Self-pay

## 2021-12-14 ENCOUNTER — Ambulatory Visit
Admission: RE | Admit: 2021-12-14 | Discharge: 2021-12-14 | Disposition: A | Discharge status: Home / Self Care | Payer: No Typology Code available for payment source | Source: Ambulatory Visit | Attending: Obstetrics and Gynecology | Admitting: Obstetrics and Gynecology

## 2021-12-14 ENCOUNTER — Other Ambulatory Visit: Payer: Self-pay | Admitting: Obstetrics and Gynecology

## 2021-12-14 DIAGNOSIS — Z1231 Encounter for screening mammogram for malignant neoplasm of breast: Secondary | ICD-10-CM

## 2021-12-19 ENCOUNTER — Other Ambulatory Visit: Payer: Self-pay | Admitting: Obstetrics and Gynecology

## 2021-12-19 DIAGNOSIS — R9389 Abnormal findings on diagnostic imaging of other specified body structures: Secondary | ICD-10-CM

## 2021-12-19 DIAGNOSIS — R928 Other abnormal and inconclusive findings on diagnostic imaging of breast: Secondary | ICD-10-CM

## 2022-01-04 ENCOUNTER — Other Ambulatory Visit: Payer: No Typology Code available for payment source

## 2022-01-10 ENCOUNTER — Ambulatory Visit
Admission: RE | Admit: 2022-01-10 | Discharge: 2022-01-10 | Disposition: A | Discharge status: Home / Self Care | Payer: 59 | Source: Ambulatory Visit | Attending: Obstetrics and Gynecology | Admitting: Obstetrics and Gynecology

## 2022-01-10 ENCOUNTER — Other Ambulatory Visit: Payer: Self-pay | Admitting: Obstetrics and Gynecology

## 2022-01-10 DIAGNOSIS — R921 Mammographic calcification found on diagnostic imaging of breast: Secondary | ICD-10-CM

## 2022-01-10 DIAGNOSIS — R928 Other abnormal and inconclusive findings on diagnostic imaging of breast: Secondary | ICD-10-CM

## 2022-01-12 ENCOUNTER — Other Ambulatory Visit: Payer: Self-pay | Admitting: Obstetrics and Gynecology

## 2022-01-12 ENCOUNTER — Ambulatory Visit
Admission: RE | Admit: 2022-01-12 | Discharge: 2022-01-12 | Disposition: A | Discharge status: Home / Self Care | Payer: 59 | Source: Ambulatory Visit | Attending: Obstetrics and Gynecology | Admitting: Obstetrics and Gynecology

## 2022-01-12 ENCOUNTER — Other Ambulatory Visit: Payer: Self-pay | Admitting: Radiology

## 2022-01-12 ENCOUNTER — Other Ambulatory Visit: Payer: Self-pay

## 2022-01-12 DIAGNOSIS — R9389 Abnormal findings on diagnostic imaging of other specified body structures: Secondary | ICD-10-CM

## 2022-01-12 HISTORY — PX: BREAST BIOPSY: SHX20

## 2022-01-12 MED ORDER — GADOBUTROL 1 MMOL/ML IV SOLN
7.0000 mL | Freq: Once | INTRAVENOUS | Status: AC | PRN
  Administered 2022-01-12: 7 mL via INTRAVENOUS

## 2022-01-24 ENCOUNTER — Other Ambulatory Visit: Payer: No Typology Code available for payment source

## 2022-01-24 ENCOUNTER — Ambulatory Visit
Admission: RE | Admit: 2022-01-24 | Discharge: 2022-01-24 | Disposition: A | Discharge status: Home / Self Care | Payer: 59 | Source: Ambulatory Visit | Attending: Obstetrics and Gynecology | Admitting: Obstetrics and Gynecology

## 2022-01-24 ENCOUNTER — Other Ambulatory Visit: Payer: Self-pay | Admitting: Obstetrics and Gynecology

## 2022-01-24 DIAGNOSIS — R921 Mammographic calcification found on diagnostic imaging of breast: Secondary | ICD-10-CM

## 2022-03-05 IMAGING — CT CT ABD-PELV W/ CM
2 of 5 series · 11 of 46 positions shown, 12 images · IV contrast (iopamidol)
Comparison: None.

CLINICAL DATA: Acute generalized abdominal pain.

EXAM:
CT ABDOMEN AND PELVIS WITH CONTRAST
TECHNIQUE: Multidetector CT imaging of the abdomen and pelvis was performed
using the standard protocol following bolus administration of
intravenous contrast.
CONTRAST:  100mL [id number] IOPAMIDOL ([id number]) INJECTION 61%

[Series 2: abd pelvis 5.00 br40 s3 axial · axial · 0.50mm/px · z∈[+1047,+1422]mm · 8 of 92 slices shown, 9 images]
[im 11/92  soft-tissue]
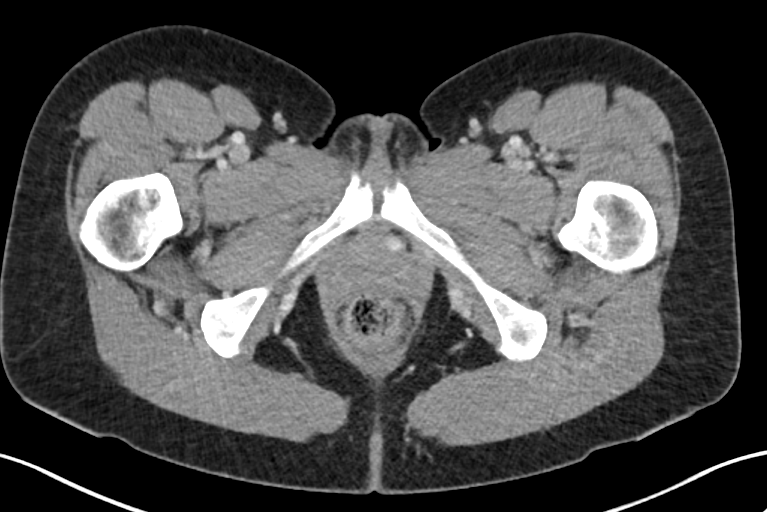
[im 11/92  bone]
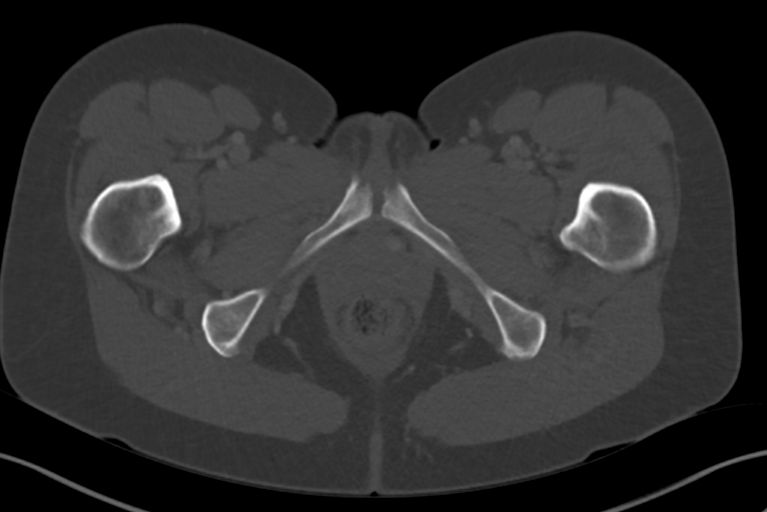
[im 22/92  soft-tissue]
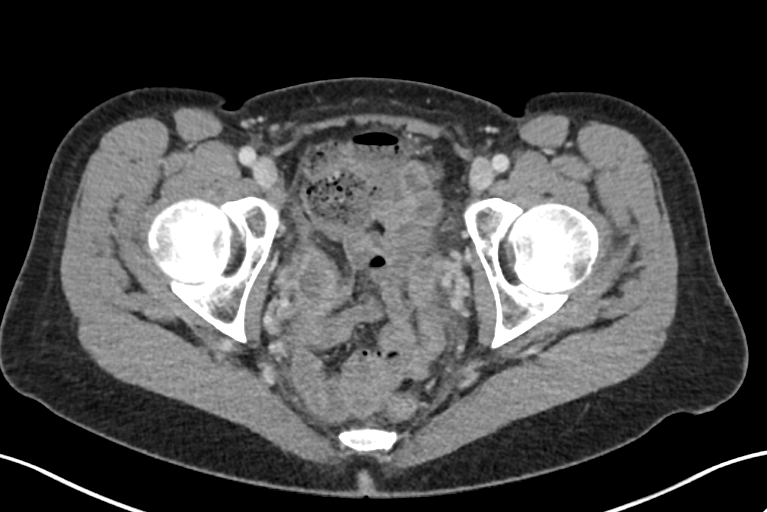
[im 33/92  soft-tissue]
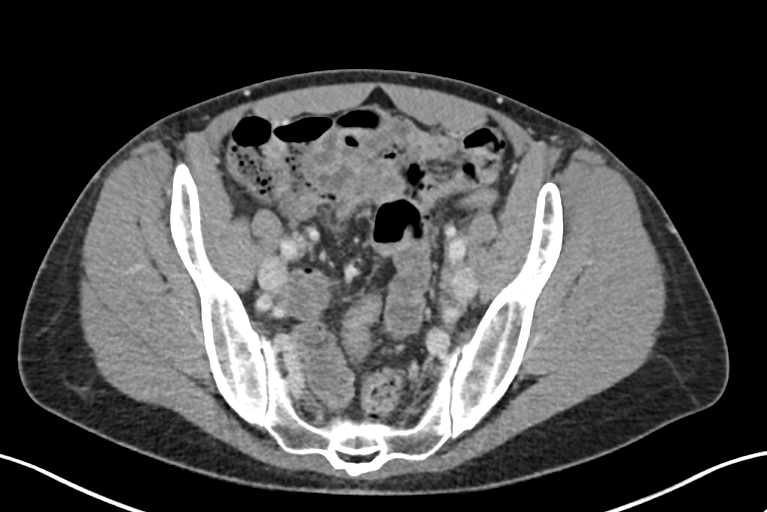
[im 43/92  soft-tissue]
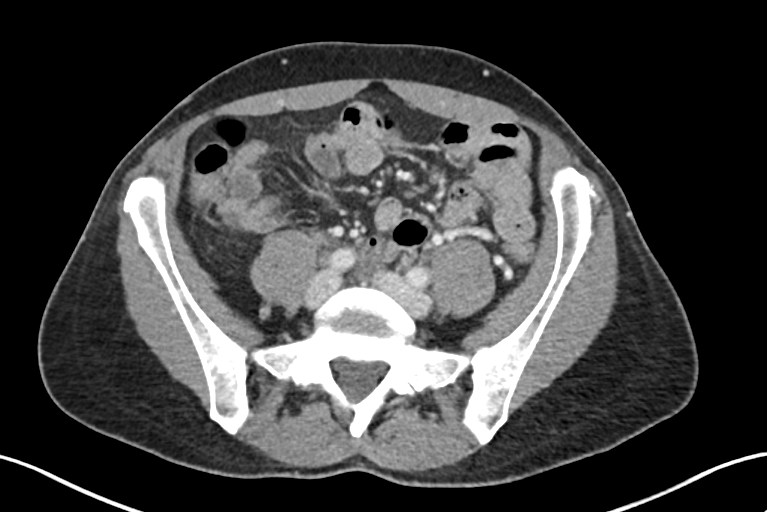
[im 54/92  soft-tissue]
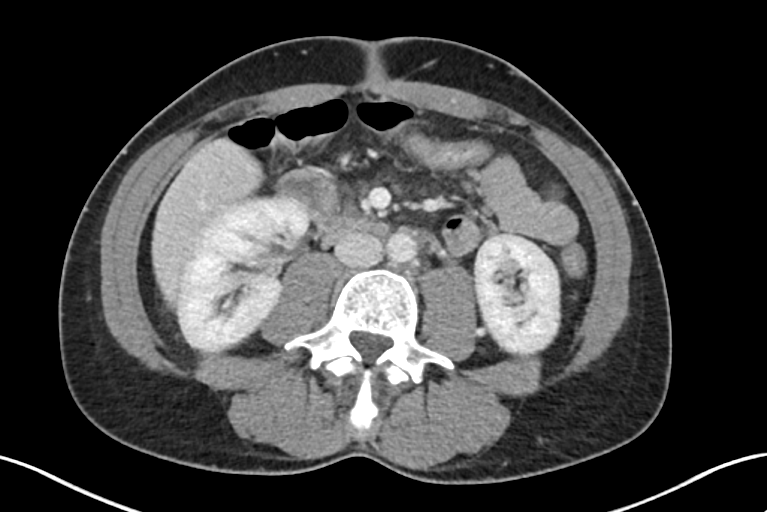
[im 65/92  soft-tissue]
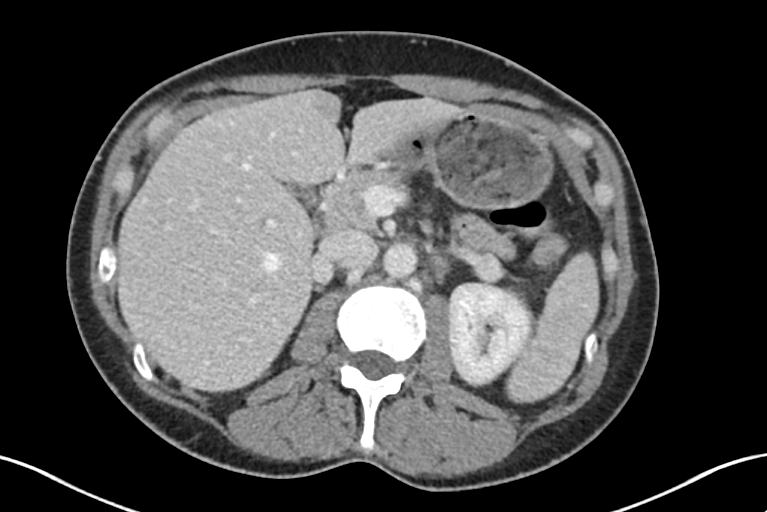
[im 75/92  soft-tissue]
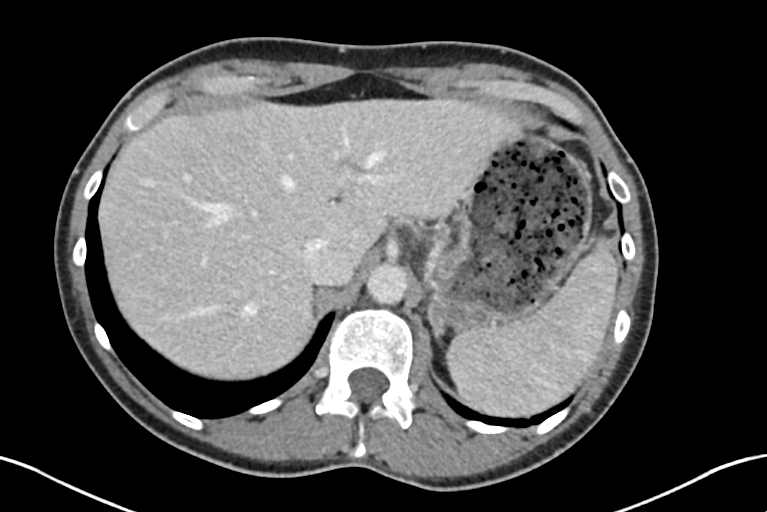
[im 86/92  soft-tissue]
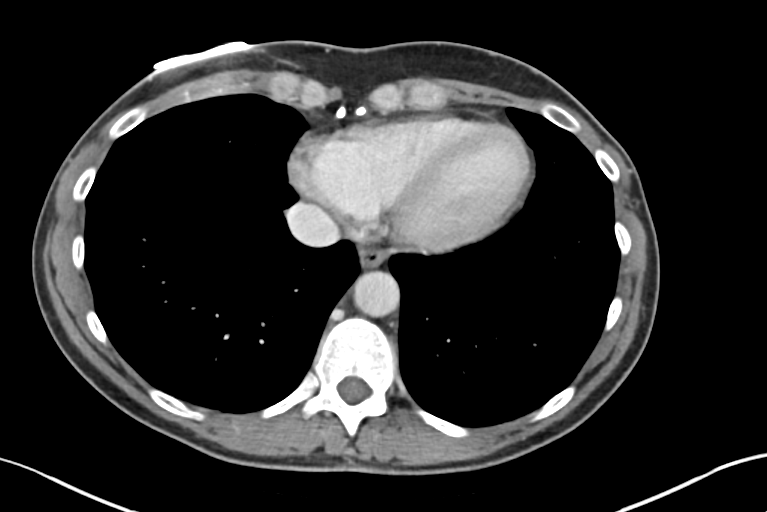

[Series 6: abd pelvis 2.00 br40 s3 cor · coronal · 0.75mm/px · 3 of 121 slices shown]
[im 41/121  soft-tissue]
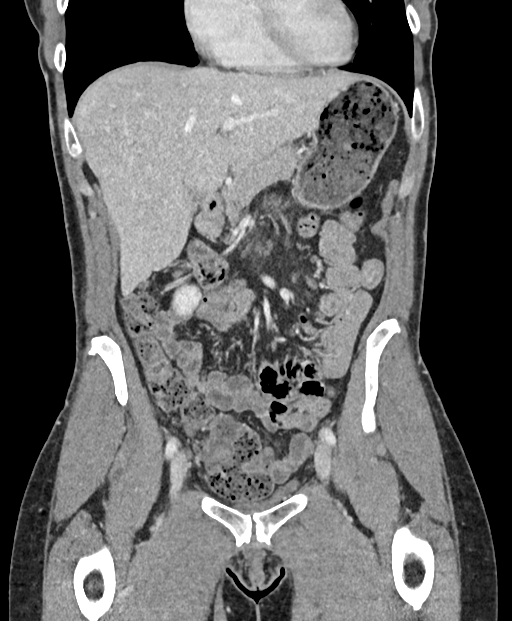
[im 54/121  soft-tissue]
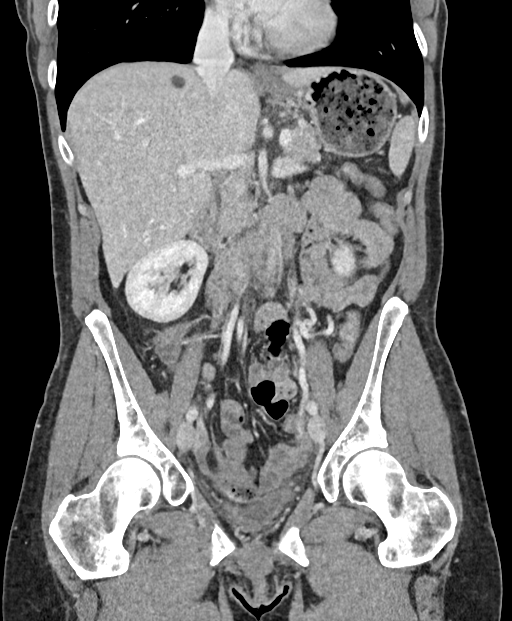
[im 67/121  soft-tissue]
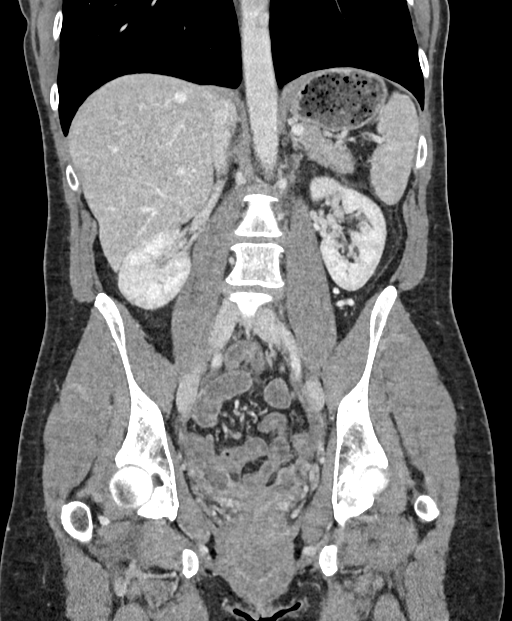

[11 of 46 positions shown; findings below may reference images not displayed]

FINDINGS: Lower chest: No acute abnormality.

Hepatobiliary: Small right hepatic cyst is noted. No gallstones are
noted. No biliary dilatation is noted.

Pancreas: Unremarkable. No pancreatic ductal dilatation or
surrounding inflammatory changes.

Spleen: Normal in size without focal abnormality.

Adrenals/Urinary Tract: Adrenal glands are unremarkable. Kidneys are
normal, without renal calculi, focal lesion, or hydronephrosis.
Bladder is unremarkable.

Stomach/Bowel: Stomach is within normal limits. Appendix appears
normal. No evidence of bowel wall thickening, distention, or
inflammatory changes.

Vascular/Lymphatic: No significant vascular findings are present. No
enlarged abdominal or pelvic lymph nodes.

Reproductive: Status post hysterectomy. No adnexal masses.

Other: No abdominal wall hernia or abnormality. No abdominopelvic
ascites.

Musculoskeletal: No acute or significant osseous findings.
IMPRESSION: No significant abnormality seen in the abdomen or pelvis.

## 2022-05-07 ENCOUNTER — Other Ambulatory Visit: Payer: Self-pay | Admitting: Obstetrics and Gynecology

## 2022-05-07 DIAGNOSIS — D249 Benign neoplasm of unspecified breast: Secondary | ICD-10-CM

## 2022-05-07 DIAGNOSIS — Z9189 Other specified personal risk factors, not elsewhere classified: Secondary | ICD-10-CM

## 2022-06-27 ENCOUNTER — Ambulatory Visit
Admission: RE | Admit: 2022-06-27 | Discharge: 2022-06-27 | Disposition: A | Discharge status: Home / Self Care | Payer: 59 | Source: Ambulatory Visit | Attending: Obstetrics and Gynecology | Admitting: Obstetrics and Gynecology

## 2022-06-27 DIAGNOSIS — D249 Benign neoplasm of unspecified breast: Secondary | ICD-10-CM

## 2022-06-27 DIAGNOSIS — Z9189 Other specified personal risk factors, not elsewhere classified: Secondary | ICD-10-CM

## 2022-06-27 MED ORDER — GADOBUTROL 1 MMOL/ML IV SOLN
7.0000 mL | Freq: Once | INTRAVENOUS | Status: AC | PRN
  Administered 2022-06-27: 7 mL via INTRAVENOUS

## 2022-07-18 ENCOUNTER — Encounter: Payer: Self-pay | Admitting: Family Medicine

## 2022-07-18 ENCOUNTER — Ambulatory Visit (INDEPENDENT_AMBULATORY_CARE_PROVIDER_SITE_OTHER): Payer: 59 | Admitting: Family Medicine

## 2022-07-18 VITALS — BP 110/76 | HR 76 | Temp 98.1°F | Ht 66.0 in | Wt 146.4 lb

## 2022-07-18 DIAGNOSIS — E785 Hyperlipidemia, unspecified: Secondary | ICD-10-CM

## 2022-07-18 DIAGNOSIS — Z0000 Encounter for general adult medical examination without abnormal findings: Secondary | ICD-10-CM | POA: Diagnosis not present

## 2022-07-18 DIAGNOSIS — Z23 Encounter for immunization: Secondary | ICD-10-CM

## 2022-07-18 LAB — CBC WITH DIFFERENTIAL/PLATELET
Basophils Absolute: 0 10*3/uL (ref 0.0–0.1)
Basophils Relative: 0.8 % (ref 0.0–3.0)
Eosinophils Absolute: 0.1 10*3/uL (ref 0.0–0.7)
Eosinophils Relative: 2.6 % (ref 0.0–5.0)
HCT: 43.5 % (ref 36.0–46.0)
Hemoglobin: 14.8 g/dL (ref 12.0–15.0)
Lymphocytes Relative: 39.9 % (ref 12.0–46.0)
Lymphs Abs: 1.5 10*3/uL (ref 0.7–4.0)
MCHC: 34 g/dL (ref 30.0–36.0)
MCV: 91.8 fl (ref 78.0–100.0)
Monocytes Absolute: 0.2 10*3/uL (ref 0.1–1.0)
Monocytes Relative: 6.6 % (ref 3.0–12.0)
Neutro Abs: 1.8 10*3/uL (ref 1.4–7.7)
Neutrophils Relative %: 50.1 % (ref 43.0–77.0)
Platelets: 221 10*3/uL (ref 150.0–400.0)
RBC: 4.73 Mil/uL (ref 3.87–5.11)
RDW: 12.4 % (ref 11.5–15.5)
WBC: 3.7 10*3/uL — ABNORMAL LOW (ref 4.0–10.5)

## 2022-07-18 LAB — LIPID PANEL
Cholesterol: 250 mg/dL — ABNORMAL HIGH (ref 0–200)
HDL: 79.9 mg/dL (ref >39.00)
LDL Cholesterol: 150 mg/dL — ABNORMAL HIGH (ref 0–99)
NonHDL: 170.16
Total CHOL/HDL Ratio: 3
Triglycerides: 103 mg/dL (ref 0.0–149.0)
VLDL: 20.6 mg/dL (ref 0.0–40.0)

## 2022-07-18 LAB — COMPREHENSIVE METABOLIC PANEL
ALT: 15 U/L (ref 0–35)
AST: 19 U/L (ref 0–37)
Albumin: 4.9 g/dL (ref 3.5–5.2)
Alkaline Phosphatase: 61 U/L (ref 39–117)
BUN: 16 mg/dL (ref 6–23)
CO2: 30 mEq/L (ref 19–32)
Calcium: 10 mg/dL (ref 8.4–10.5)
Chloride: 102 mEq/L (ref 96–112)
Creatinine, Ser: 0.9 mg/dL (ref 0.40–1.20)
GFR: 73.59 mL/min (ref >60.00)
Glucose, Bld: 96 mg/dL (ref 70–99)
Potassium: 4.8 mEq/L (ref 3.5–5.1)
Sodium: 140 mEq/L (ref 135–145)
Total Bilirubin: 0.9 mg/dL (ref 0.2–1.2)
Total Protein: 7.2 g/dL (ref 6.0–8.3)

## 2022-07-18 LAB — TSH: TSH: 1.66 u[IU]/mL (ref 0.35–5.50)

## 2022-07-18 NOTE — Patient Instructions (Addendum)
Health Maintenance Due  Topic Date Due   Zoster Vaccines- Shingrix (1 of 2)- Shingrix #1 today. Repeat injection in 2-5 months. Schedule a nurse visit for the 2nd injection before you leave today (at the check out desk)  Never done   INFLUENZA VACCINE  Flu shot- we should have these available within a month or two but please let us know if you get at outside pharmacy  07/17/2022   Could try voltaren 4x a day on the areas of the feet that bother you for 2 weeks  Also could call one of the following for another opinion: Dr. Doran Durand [facility] Dr. Tamala Julian or Dr. Georgina Snell with [facility] (non surgical sports medicine physicians)  Please stop by lab before you go If you have mychart- we will send your results within 3 business days of [place] receiving them.  If you do not have mychart- we will call you about results within 5 business days of [place] receiving them.  *please also note that you will see labs on mychart as soon as they post. I will later go in and write notes on them- will say "notes from Dr. Yong Channel"   Recommended follow up: Return in about 1 year (around 07/19/2023) for physical or sooner if needed.Schedule b4 you leave.

## 2022-07-18 NOTE — Progress Notes (Signed)
Phone [phone]   Subjective:  Patient presents today for their annual physical. Chief complaint-noted.   See problem oriented charting- ROS- full  review of systems was completed and negative except for: joint pain, back pain, trouble with sleep  The following were reviewed and entered/updated in epic: Past Medical History:  Diagnosis Date   COVID 01/17/2021   asymptomatic   Family history of genetic disorder 07/22/2020   Family history of malignant neoplasm of ovary    Family history of prostate cancer 07/22/2020   GERD (gastroesophageal reflux disease)    Rosanna Randy syndrome    noted by Dr. Linna Darner. last bilirubin ok   H/O dysmenorrhea    H/O menorrhagia    ablation 2011   Headache(784.0)    migraine   History of hiatal hernia    Patient Active Problem List   Diagnosis Date Noted   Monoallelic mutation of CHEK2 gene in female patient 08/08/2020    Priority: High   History of total hysterectomy 07/13/2021    Priority: Medium    Family history of ovarian cancer 07/12/2020    Priority: Medium    Hyperlipidemia 10/28/2017    Priority: Medium    Raynaud phenomenon 08/10/2016    Priority: Medium    GERD 01/20/2009    Priority: Medium    Migraine 01/20/2009    Priority: Medium    Genetic testing 08/08/2020    Priority: Low   Family history of genetic disorder 07/22/2020    Priority: Low   Family history of prostate cancer 07/22/2020    Priority: Low   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    Priority: Low   Blood in stool 07/19/2010    Priority: Low   IBS 04/21/2010    Priority: Low   Past Surgical History:  Procedure Laterality Date   ABDOMINAL HYSTERECTOMY  02/16/2021   BREAST BIOPSY Left 01/12/2022   COLONOSCOPY  09/08/2010, jan 2022   rectal bleeding; "runner's colitis"--[facility]   DILATION AND CURETTAGE OF UTERUS  yrs ago   Dr Raphael Gibney   HYSTEROSCOPY  2011   with polypectomy & ablation   NOVASURE ABLATION  2011   endometrial    ROBOTIC ASSISTED  LAPAROSCOPIC HYSTERECTOMY AND SALPINGECTOMY Bilateral 02/16/2021   Procedure: XI ROBOTIC ASSISTED LAPAROSCOPIC HYSTERECTOMY AND SALPINGECTOMY;  Surgeon: Delsa Bern, MD;  Location: [facility];  Service: Gynecology;  Laterality: Bilateral;   WISDOM TOOTH EXTRACTION  yrs ago    Family History  Problem Relation Age of Onset   Breast cancer Mother 69   Non-Hodgkin's lymphoma Mother 59   Brain cancer Mother        glioblastoma; dx early 48s   Heart disease Father 64        4 vessel CBAG   Prostate cancer Father 68   Hypothyroidism Father    Colon polyps Father    Dementia Father        age 44 in 2023   Hyperthyroidism Sister    Cervical cancer Sister        dx mid 18s   Prostate cancer Brother 66   Uterine cancer Maternal Grandmother        dx mid 43s   Hyperlipidemia Paternal Grandfather    Heart disease Paternal Grandfather        pacer   Ovarian cancer Paternal Aunt 79   Other Paternal Aunt        low penetrance CHEK2 mutation (I157T)   Other Paternal Aunt 3       brain  tumor   Stroke Neg Hx    Diabetes Neg Hx    Ulcers Neg Hx    Esophageal cancer Neg Hx    Stomach cancer Neg Hx    Rectal cancer Neg Hx    Colon cancer Neg Hx     Medications- reviewed and updated Current Outpatient Medications  Medication Sig Dispense Refill   Cholecalciferol (VITAMIN D3) 2000 UNITS TABS Take by mouth daily.     loratadine (CLARITIN) 10 MG tablet Take 10 mg by mouth daily as needed.     MAGNESIUM PO Take by mouth.     Multiple Vitamin (MULTIVITAMIN) tablet Take 1 tablet by mouth daily.     OMEGA 3 1000 MG CAPS Take by mouth daily.     rizatriptan (MAXALT) 10 MG tablet May repeat in 2 hours if needed 9 tablet 11   No current facility-administered medications for this visit.    Allergies-reviewed and updated Allergies  Allergen Reactions   Biaxin [Clarithromycin] Other (See Comments)    REACTION: METALLIC TASTE    Social History   Social History Narrative    Married 1995. 2 sons- 3 and 58 in 2017.       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 Oropharynx normal Neck: no thyromegaly CV: RRR no murmurs rubs or gallops Lungs: CTAB no crackles, wheeze, rhonchi Abdomen: soft/nontender/nondistended/normal bowel sounds. No rebound or guarding.  Ext: no edema Skin: warm, dry Neuro: grossly normal, moves all extremities, PERRLA Moves more slowly than last year- appears to be in mild discomfort from back   Assessment and Plan   51 y.o. female presenting for annual physical.  Health Maintenance counseling: 1. Anticipatory guidance: Patient counseled regarding regular dental exams -q6 months, eye exams - yearly,  avoiding smoking and second hand smoke , limiting alcohol to 1 beverage per day- right around 7 , no illicit drugs .   2. Risk factor reduction:  Advised patient of need for regular exercise and diet rich and fruits and vegetables to reduce risk of heart attack and stroke.  Exercise- down recently related to back and foot issues- doing some running still and still doing weights- still doing crossfit type exercises- may get back to her yoga- encouraged Diet/weight management- weight largely stable- healthy weight Wt Readings from Last 3 Encounters:  07/18/22 146 lb 6.4 oz (66.4 kg)  07/13/21 142 lb 9.6 oz (64.7 kg)  02/16/21 141 lb 3.2 oz (64 kg)  3. Immunizations/screenings/ancillary studies- shingrix - start today, consider covid 19 vaccine once updated- she prefers to hold off, fall flu shot  Immunization History  Administered Date(s) Administered   Hepatitis A  05/24/2016   Hepatitis A, Adult 07/13/2021   Influenza,inj,Quad PF,6+ Mos 10/03/2017, 09/25/2018, 09/24/2019, 09/28/2020   Influenza,inj,Quad PF,6-35 Mos 09/28/2020   Influenza,inj,quad, With Preservative 09/16/2020   Influenza-Unspecified 09/16/2013, 09/17/2014, 09/18/2016, 09/25/2018   Td 10/22/2006   Tdap 06/30/2010, 07/12/2020   Typhoid Inactivated 05/24/2016   Unspecified SARS-COV-2 Vaccination 01/22/2020, 02/19/2020  4. Cervical cancer screening- hysterectomy for benign reasons  total- still sees gyn 5. Breast cancer screening-  breast exam with GYN and mammogram - actually was told to consider breast MRIs yearly in addition to mammograms due to lifetime breast cancer risk - did require biopsies recently thankfully benign and did have MR breast 06/27/22 reassuring 6. Colon cancer screening - q5 years due to CHEK2 variant - last 12/27/20 7. Skin cancer screening- sees Dr. [place]- severe atypia but not melanoma. advised regular sunscreen use. Denies worrisome, changing, or new skin lesions.  8. Birth control/STD check- hysterectomy and only active with husband 75. Osteoporosis screening at 35- will plan on this- does take vitamin D  10. Smoking associated screening - never smoker  Status of chronic or acute concerns   #social update- lost mother to glioblastoma over 3 years ago. Father with dementia - still checks on him regularly- he doesn't know who she is.   #chek2 mutation- see discussions above -also wants tsh  #Exercise has been affected   - Dr. Layne Benton murphy/wainer saw her for neuroma- would like second opinion- mainly options given were inserts. Went to the foot store and inserts were $1400. Bunion. Right foot pain related to this- has bunion on that foot but not causing pain that shes aware of. Was told had bone spur on lateral portion of foot and can burn.  - also some back issues - for foot from avs "Could try voltaren 4x a day on the areas of the feet that bother you for 2  weeks  Also could call one of the following for another opinion: Dr. Doran Durand [facility] Dr. Tamala Julian or Dr. Georgina Snell with [facility] (non surgical sports medicine physicians)" -also could discuss back pain with SM  #some sleep issues- wonders if hormonal issues- progesterone cream and other treatments through GYN at least helping heat flashes. Magnesium helps some. Melatonin tried in past and not helpful at 3 mg  #hyperlipidemia S: Medication: omega 3  and doing metamucil daily The 10-year ASCVD risk score (Arnett DK, et al., 2019) is: 0.9%  Lab Results  Component Value Date   CHOL 254 (H) 07/13/2021   HDL 83.80 07/13/2021   LDLCALC 152 (H) 07/13/2021   TRIG 87.0 07/13/2021   CHOLHDL 3 07/13/2021   A/P: she is curious to see if improved with metamucil- update lipids  # migraines S:medication: rizatriptan 10 mg as needed- better ltaely- once every other month A/P: reasonable control- continue current meds   #gerd- prefers to aoid PPI- mainly controls with diet  Recommended follow up: Return in about 1 year (around 07/19/2023) for physical or sooner if needed.Schedule b4 you leave.  Lab/Order associations: fasting   ICD-10-CM   1. Preventative health care  Z00.00 TSH    CBC with Differential/Platelet    Comprehensive metabolic panel    Lipid panel    2. Hyperlipidemia, unspecified hyperlipidemia type  E78.5 TSH    CBC with Differential/Platelet    Comprehensive metabolic panel    Lipid panel      No orders of the defined types were placed in this encounter.   Return precautions advised.  Garret Reddish, MD

## 2022-07-18 NOTE — Addendum Note (Signed)
Addended by: Bing Neighbors on: 07/18/2022 11:31 AM   Modules accepted: Orders

## 2022-09-10 ENCOUNTER — Encounter: Payer: Self-pay | Admitting: *Deleted

## 2022-10-09 ENCOUNTER — Ambulatory Visit: Payer: 59

## 2022-10-10 ENCOUNTER — Ambulatory Visit (INDEPENDENT_AMBULATORY_CARE_PROVIDER_SITE_OTHER): Payer: 59 | Admitting: *Deleted

## 2022-10-10 DIAGNOSIS — Z23 Encounter for immunization: Secondary | ICD-10-CM

## 2022-10-10 NOTE — Progress Notes (Signed)
Per orders of Dr. Yong Channel, injection of 2nd Shingles Vaccine given in left deltoid per patient preference by Zacarias Pontes, CMA. Patient tolerated injection well.

## 2022-10-29 ENCOUNTER — Encounter: Payer: Self-pay | Admitting: Family Medicine

## 2022-11-21 ENCOUNTER — Other Ambulatory Visit: Payer: Self-pay | Admitting: Obstetrics and Gynecology

## 2022-11-21 DIAGNOSIS — Z1231 Encounter for screening mammogram for malignant neoplasm of breast: Secondary | ICD-10-CM

## 2022-12-17 HISTORY — PX: BUNIONECTOMY: SHX129

## 2023-01-07 IMAGING — MG MM BREAST LOCALIZATION CLIP
4 series · 4 of 12 positions shown · non-contrast
Comparison: Previous exam(s).

CLINICAL DATA: Confirmation of clip placement after stereotactic
tomosynthesis core needle biopsy of indeterminate calcifications
involving the LOWER OUTER QUADRANT the RIGHT breast.

EXAM:
2D and 3D DIAGNOSTIC RIGHT MAMMOGRAM POST STEREOTACTIC TOMOSYNTHESIS
BIOPSY

[R LM synth-2D]
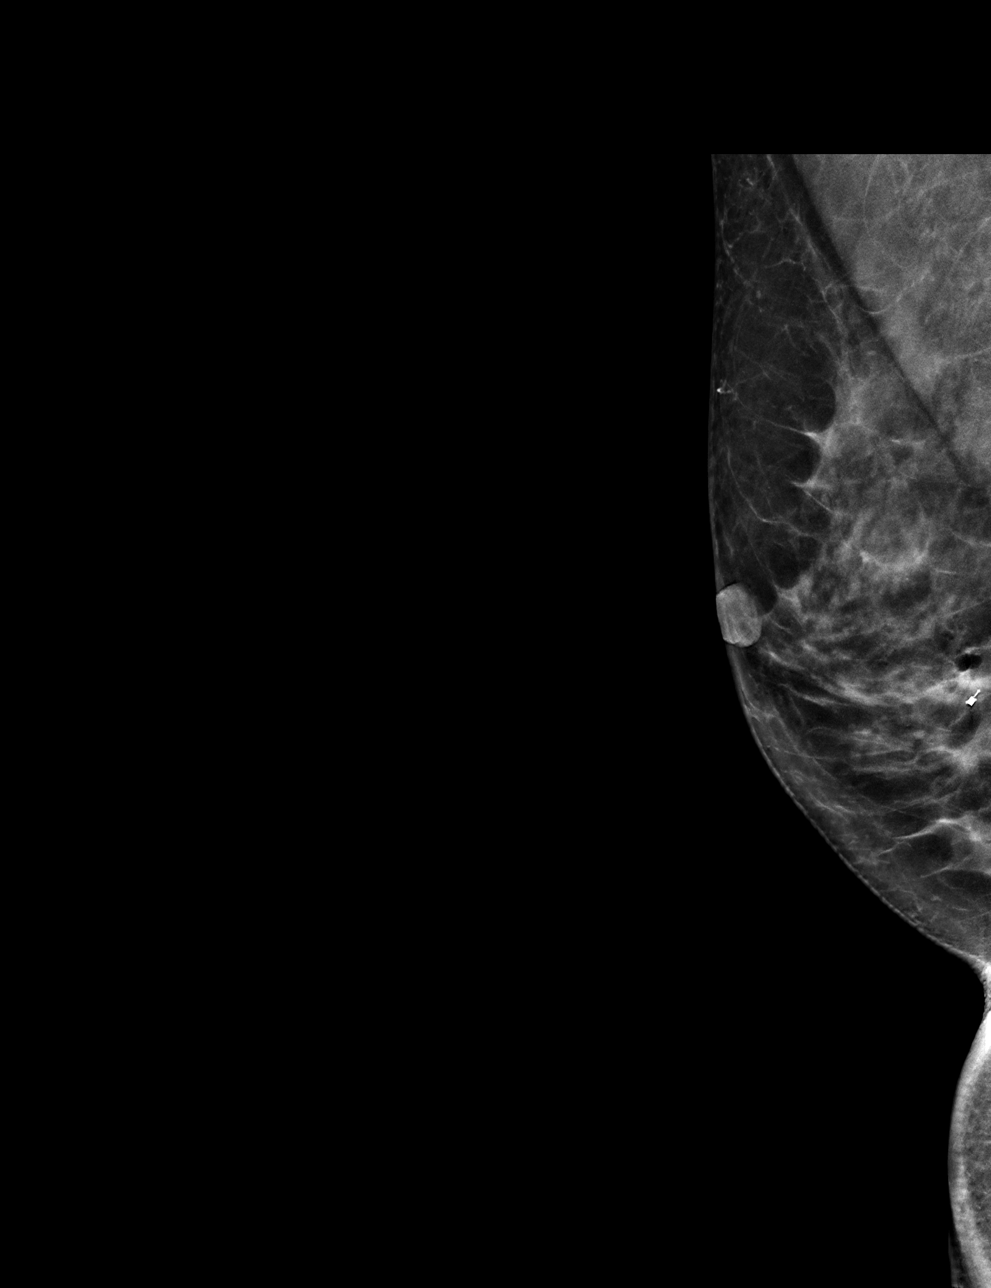

[R CC synth-2D]
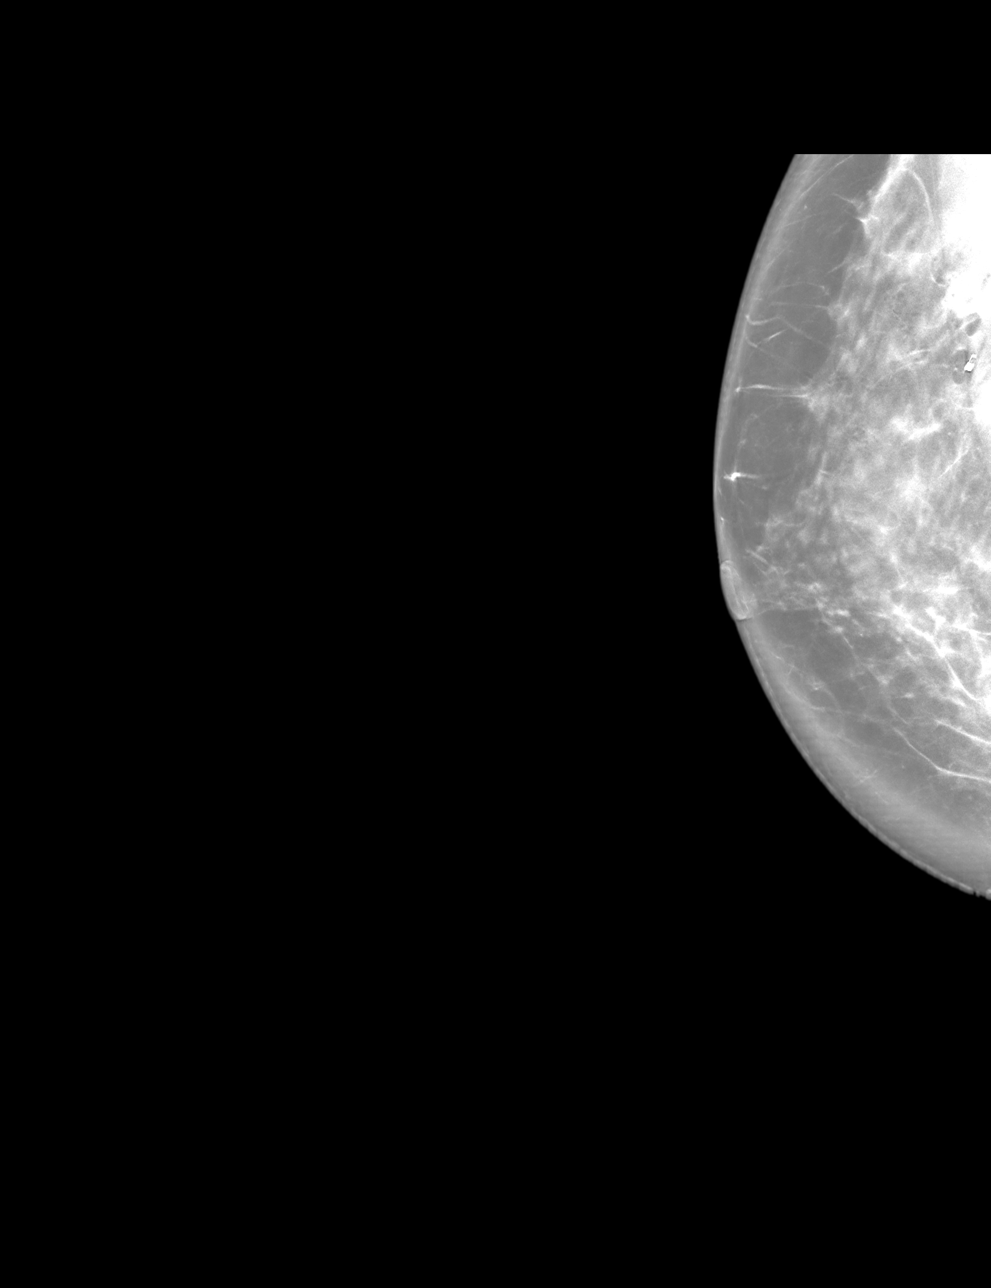

[R CC tomo · tomo slice 38/75.0]
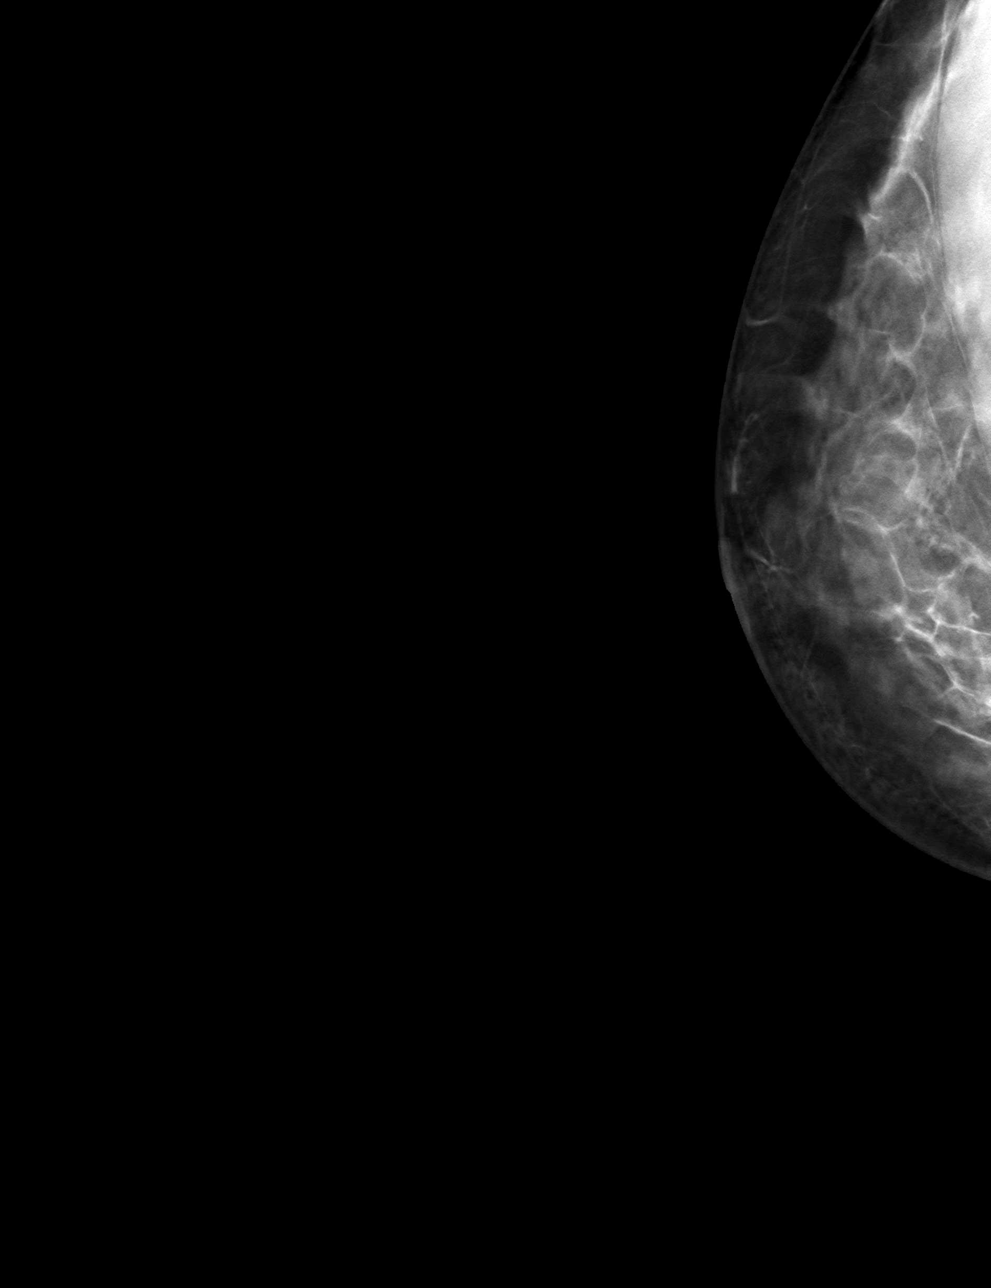

[R LM tomo · tomo slice 30/59.0]
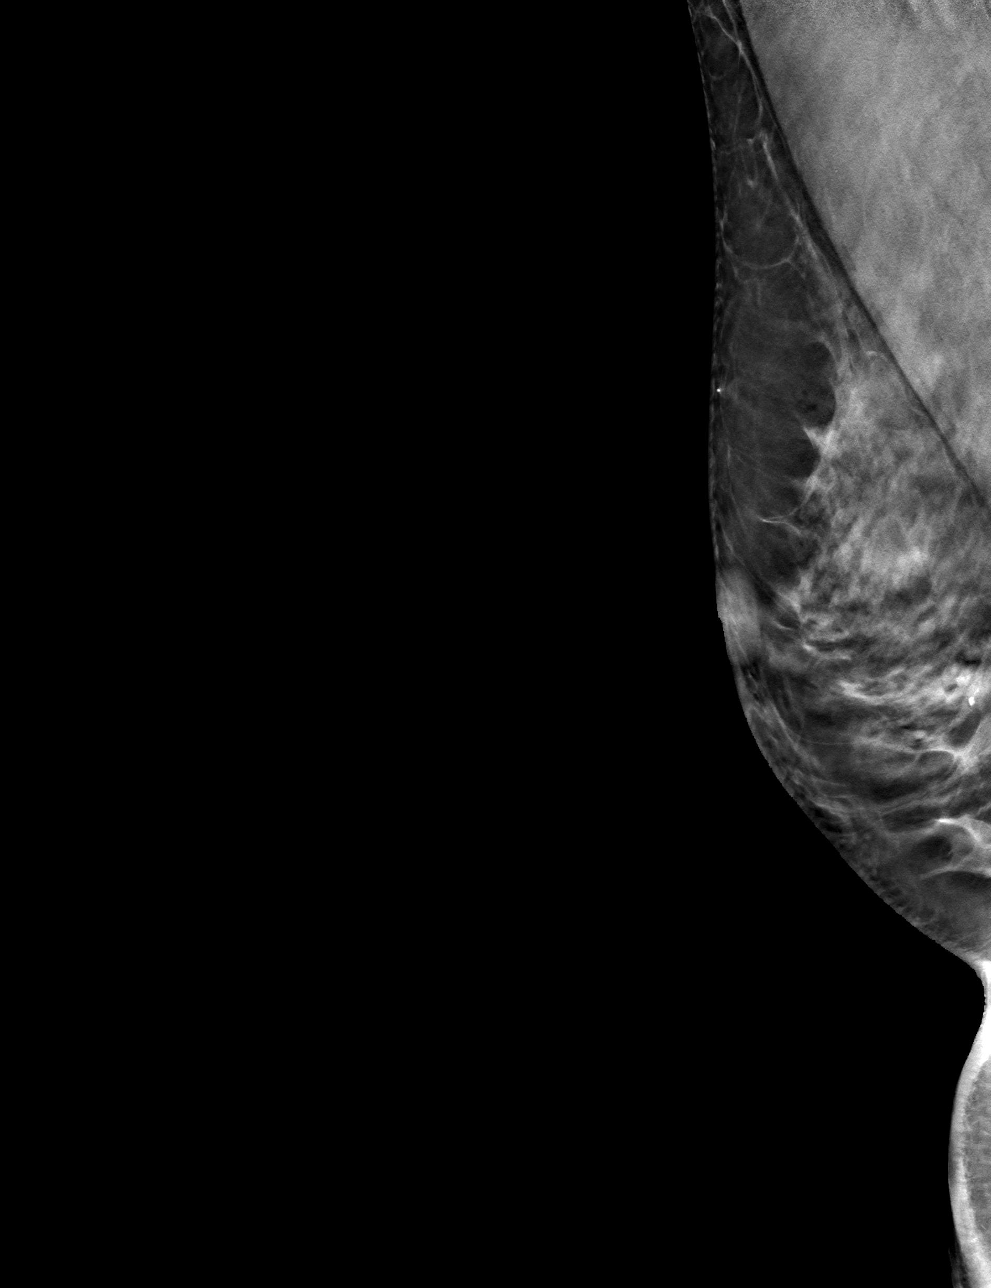

[4 of 12 positions shown; findings below may reference images not displayed]

FINDINGS: 2D and 3D full field CC and mediolateral images were obtained
following stereotactic tomosynthesis guided biopsy of calcifications
involving the lower quadrant the RIGHT breast. The coil shaped
biopsy marking clip is appropriately positioned at the site of the
biopsied calcifications. There are faint residual calcifications at
the biopsy site.

Expected post biopsy changes are present without evidence of
hematoma.
IMPRESSION: Appropriate positioning of the coil shaped biopsy marking clip at
the site of the biopsied calcifications in the LOWER OUTER QUADRANT
of the RIGHT breast. There are faint residual calcifications at the
biopsy site.

Final Assessment: Post Procedure Mammograms for Marker Placement

## 2023-01-16 ENCOUNTER — Ambulatory Visit
Admission: RE | Admit: 2023-01-16 | Discharge: 2023-01-16 | Disposition: A | Discharge status: Home / Self Care | Payer: 59 | Source: Ambulatory Visit | Attending: Obstetrics and Gynecology | Admitting: Obstetrics and Gynecology

## 2023-01-16 DIAGNOSIS — Z1231 Encounter for screening mammogram for malignant neoplasm of breast: Secondary | ICD-10-CM

## 2023-11-04 ENCOUNTER — Encounter: Payer: Self-pay | Admitting: Family Medicine

## 2023-11-20 ENCOUNTER — Other Ambulatory Visit: Payer: Self-pay | Admitting: Obstetrics and Gynecology

## 2023-11-20 DIAGNOSIS — Z1231 Encounter for screening mammogram for malignant neoplasm of breast: Secondary | ICD-10-CM

## 2023-11-20 DIAGNOSIS — R898 Other abnormal findings in specimens from other organs, systems and tissues: Secondary | ICD-10-CM

## 2023-12-19 ENCOUNTER — Encounter: Payer: 59 | Admitting: Family Medicine

## 2024-02-05 ENCOUNTER — Ambulatory Visit: Payer: 59

## 2024-02-07 ENCOUNTER — Ambulatory Visit: Payer: 59

## 2024-02-20 ENCOUNTER — Ambulatory Visit
Admission: RE | Admit: 2024-02-20 | Discharge: 2024-02-20 | Disposition: A | Discharge status: Home / Self Care | Payer: 59 | Source: Ambulatory Visit | Attending: Obstetrics and Gynecology | Admitting: Obstetrics and Gynecology

## 2024-02-20 DIAGNOSIS — Z1231 Encounter for screening mammogram for malignant neoplasm of breast: Secondary | ICD-10-CM

## 2024-04-06 ENCOUNTER — Ambulatory Visit (INDEPENDENT_AMBULATORY_CARE_PROVIDER_SITE_OTHER): Payer: 59 | Admitting: Family Medicine

## 2024-04-06 ENCOUNTER — Encounter: Payer: Self-pay | Admitting: Family Medicine

## 2024-04-06 VITALS — BP 110/68 | HR 73 | Temp 97.5°F | Resp 16 | Ht 66.3 in | Wt 150.0 lb

## 2024-04-06 DIAGNOSIS — G43C0 Periodic headache syndromes in child or adult, not intractable: Secondary | ICD-10-CM

## 2024-04-06 DIAGNOSIS — Z8349 Family history of other endocrine, nutritional and metabolic diseases: Secondary | ICD-10-CM | POA: Diagnosis not present

## 2024-04-06 DIAGNOSIS — Z0000 Encounter for general adult medical examination without abnormal findings: Secondary | ICD-10-CM | POA: Diagnosis not present

## 2024-04-06 DIAGNOSIS — E785 Hyperlipidemia, unspecified: Secondary | ICD-10-CM | POA: Diagnosis not present

## 2024-04-06 LAB — COMPREHENSIVE METABOLIC PANEL WITH GFR
ALT: 14 U/L (ref 0–35)
AST: 21 U/L (ref 0–37)
Albumin: 4.6 g/dL (ref 3.5–5.2)
Alkaline Phosphatase: 39 U/L (ref 39–117)
BUN: 15 mg/dL (ref 6–23)
CO2: 30 meq/L (ref 19–32)
Calcium: 9.6 mg/dL (ref 8.4–10.5)
Chloride: 102 meq/L (ref 96–112)
Creatinine, Ser: 0.85 mg/dL (ref 0.40–1.20)
GFR: 77.87 mL/min (ref >60.00)
Glucose, Bld: 100 mg/dL — ABNORMAL HIGH (ref 70–99)
Potassium: 4.3 meq/L (ref 3.5–5.1)
Sodium: 140 meq/L (ref 135–145)
Total Bilirubin: 0.8 mg/dL (ref 0.2–1.2)
Total Protein: 6.7 g/dL (ref 6.0–8.3)

## 2024-04-06 LAB — IBC + FERRITIN
Ferritin: 73.1 ng/mL (ref 10.0–291.0)
Iron: 136 ug/dL (ref 42–145)
Saturation Ratios: 45.4 % (ref 20.0–50.0)
TIBC: 299.6 ug/dL (ref 250.0–450.0)
Transferrin: 214 mg/dL (ref 212.0–360.0)

## 2024-04-06 LAB — CBC WITH DIFFERENTIAL/PLATELET
Basophils Absolute: 0 10*3/uL (ref 0.0–0.1)
Basophils Relative: 0.4 % (ref 0.0–3.0)
Eosinophils Absolute: 0.1 10*3/uL (ref 0.0–0.7)
Eosinophils Relative: 1.8 % (ref 0.0–5.0)
HCT: 41.6 % (ref 36.0–46.0)
Hemoglobin: 14.1 g/dL (ref 12.0–15.0)
Lymphocytes Relative: 23.8 % (ref 12.0–46.0)
Lymphs Abs: 1.4 10*3/uL (ref 0.7–4.0)
MCHC: 33.8 g/dL (ref 30.0–36.0)
MCV: 92.4 fl (ref 78.0–100.0)
Monocytes Absolute: 0.3 10*3/uL (ref 0.1–1.0)
Monocytes Relative: 5 % (ref 3.0–12.0)
Neutro Abs: 4.2 10*3/uL (ref 1.4–7.7)
Neutrophils Relative %: 69 % (ref 43.0–77.0)
Platelets: 224 10*3/uL (ref 150.0–400.0)
RBC: 4.5 Mil/uL (ref 3.87–5.11)
RDW: 12.6 % (ref 11.5–15.5)
WBC: 6.1 10*3/uL (ref 4.0–10.5)

## 2024-04-06 LAB — TSH: TSH: 1.59 u[IU]/mL (ref 0.35–5.50)

## 2024-04-06 LAB — LIPID PANEL
Cholesterol: 210 mg/dL — ABNORMAL HIGH (ref 0–200)
HDL: 71.1 mg/dL (ref >39.00)
LDL Cholesterol: 121 mg/dL — ABNORMAL HIGH (ref 0–99)
NonHDL: 138.93
Total CHOL/HDL Ratio: 3
Triglycerides: 92 mg/dL (ref 0.0–149.0)
VLDL: 18.4 mg/dL (ref 0.0–40.0)

## 2024-04-06 MED ORDER — RIZATRIPTAN BENZOATE 10 MG PO TABS: ORAL_TABLET | ORAL | 11 refills | Status: AC

## 2024-04-06 NOTE — Progress Notes (Signed)
 Phone [phone]   Subjective:  Patient presents today for their annual physical. Chief complaint-noted.   See problem oriented charting- ROS- full  review of systems was completed and negative Per full ROS sheet completed by patient  The following were reviewed and entered/updated in epic: Past Medical History:  Diagnosis Date   COVID 01/17/2021   asymptomatic   Family history of genetic disorder 07/22/2020   Family history of malignant neoplasm of ovary    Family history of prostate cancer 07/22/2020   GERD (gastroesophageal reflux disease)    Oletta Berry syndrome    noted by Dr. Donnice Gale. last bilirubin ok   H/O dysmenorrhea    H/O menorrhagia    ablation 2011   Headache(784.0)    migraine   History of hiatal hernia    Patient Active Problem List   Diagnosis Date Noted   Monoallelic mutation of CHEK2 gene in female patient 08/08/2020    Priority: High   History of total hysterectomy 07/13/2021    Priority: Medium    Family history of ovarian cancer 07/12/2020    Priority: Medium    Hyperlipidemia 10/28/2017    Priority: Medium    Raynaud phenomenon 08/10/2016    Priority: Medium    GERD 01/20/2009    Priority: Medium    Migraine 01/20/2009    Priority: Medium    Genetic testing 08/08/2020    Priority: Low   Family history of genetic disorder 07/22/2020    Priority: Low   Family history of prostate cancer 07/22/2020    Priority: Low   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    Priority: Low   Blood in stool 07/19/2010    Priority: Low   IBS 04/21/2010    Priority: Low   Past Surgical History:  Procedure Laterality Date   ABDOMINAL HYSTERECTOMY  02/16/2021   BREAST BIOPSY Left 01/12/2022   BUNIONECTOMY Right 2024   January 2024- also had neuroma removal   COLONOSCOPY  09/08/2010, jan 2022   rectal bleeding; "runner's colitis"--[facility]   DILATION AND CURETTAGE OF UTERUS  yrs ago   Dr Harlon Light   HYSTEROSCOPY  2011   with polypectomy & ablation   NOVASURE  ABLATION  2011   endometrial    ROBOTIC ASSISTED LAPAROSCOPIC HYSTERECTOMY AND SALPINGECTOMY Bilateral 02/16/2021   Procedure: XI ROBOTIC ASSISTED LAPAROSCOPIC HYSTERECTOMY AND SALPINGECTOMY;  Surgeon: Ona Bidding, MD;  Location: [facility];  Service: Gynecology;  Laterality: Bilateral;   WISDOM TOOTH EXTRACTION  yrs ago    Family History  Problem Relation Age of Onset   Breast cancer Mother 61   Non-Hodgkin's lymphoma Mother 20   Brain cancer Mother        glioblastoma; dx early 69s   Cancer Mother    Heart disease Father 43        4 vessel CBAG   Prostate cancer Father 17   Hypothyroidism Father    Colon polyps Father    Dementia Father        died at 80   Cancer Father    Hyperlipidemia Father    Hyperthyroidism Sister    Cervical cancer Sister        dx mid 11s   Hyperlipidemia Sister    Prostate cancer Brother 20   Cancer Brother    Hyperlipidemia Brother    Uterine cancer Maternal Grandmother        dx mid 33s   Hyperlipidemia Paternal Grandfather    Heart disease Paternal Grandfather  pacer   Ovarian cancer Paternal Aunt 13   Other Paternal Aunt        low penetrance CHEK2 mutation (I157T)   Other Paternal Aunt 3       brain tumor   Stroke Neg Hx    Diabetes Neg Hx    Ulcers Neg Hx    Esophageal cancer Neg Hx    Stomach cancer Neg Hx    Rectal cancer Neg Hx    Colon cancer Neg Hx     Medications- reviewed and updated Current Outpatient Medications  Medication Sig Dispense Refill   Cholecalciferol (VITAMIN D3) 2000 UNITS TABS Take by mouth daily.     loratadine (CLARITIN) 10 MG tablet Take 10 mg by mouth daily as needed.     MAGNESIUM PO Take by mouth.     Multiple Vitamin (MULTIVITAMIN) tablet Take 1 tablet by mouth daily.     OMEGA 3 1000 MG CAPS Take by mouth daily.     rizatriptan  (MAXALT ) 10 MG tablet Once daily May repeat in 2 hours if needed (max 2 doses per day) 9 tablet 11   No current facility-administered medications  for this visit.    Allergies-reviewed and updated Allergies  Allergen Reactions   Biaxin [Clarithromycin] Other (See Comments)    REACTION: METALLIC TASTE    Social History   Social History Narrative   Married 1995. 2 sons- oldest at [employer] and youngest in [place] doing Airline pilot.       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 Oropharynx normal Neck: no thyromegaly CV: RRR no murmurs rubs or gallops Lungs: CTAB no crackles, wheeze, rhonchi Abdomen: soft/nontender/nondistended/normal bowel sounds. No rebound or guarding.  Ext: no edema Skin: warm, dry Neuro: grossly normal, moves all extremities, PERRLA   Assessment and Plan   53 y.o. female presenting for annual physical.  Health Maintenance counseling: 1. Anticipatory guidance: Patient counseled regarding regular dental exams -q6 months, eye exams - yearly,  avoiding smoking and second hand smoke , limiting alcohol to 1 beverage per day- 1 a day- had been up to 2 and cut back- also to support brother and reduce breast cancer risk , no illicit drugs.   2. Risk factor reduction:  Advised patient of need for regular exercise and diet rich and fruits and vegetables to reduce risk of heart attack and stroke.  Exercise- still doing regular CrossFit style workouts and peloton and weights  Diet/weight management-eating well- weight up 4 pounds from previous but still with very healthy weight.  Wt Readings from Last 3 Encounters:  04/06/24 150 lb (68 kg)  07/18/22 146 lb  6.4 oz (66.4 kg)  07/13/21 142 lb 9.6 oz (64.7 kg)  3. Immunizations/screenings/ancillary studies-fully up-to-date-holding off on COVID-19 vaccinations  Immunization History  Administered Date(s) Administered   Hepatitis A 05/24/2016   Hepatitis A, Adult 07/13/2021   Influenza, Seasonal, Injecte, Preservative Fre 11/04/2023   Influenza,inj,Quad PF,6+ Mos 10/03/2017, 09/25/2018,  09/24/2019, 09/28/2020   Influenza,inj,Quad PF,6-35 Mos 09/28/2020   Influenza,inj,quad, With Preservative 09/16/2020   Influenza-Unspecified 09/16/2013, 09/17/2014, 09/18/2016, 09/25/2018   Td 10/22/2006   Tdap 06/30/2010, 07/12/2020   Typhoid Inactivated 05/24/2016   Unspecified SARS-COV-2 Vaccination 01/22/2020, 02/19/2020   Zoster Recombinant(Shingrix ) 07/18/2022, 10/10/2022  4. Cervical cancer screening- hysterectomy for benign reasons total- still sees gynecology  5. Breast cancer screening-  breast exam with GYN and mammogram - actually was told to consider breast MRIs yearly in addition to mammograms due to lifetime breast cancer risk - she is currently scheduled for breast MRI through Dr. Duke Gibbons on 07/21/2024 6. Colon cancer screening - q5 years due to CHEK2 variant - last 12/27/20  7. Skin cancer screening- sees Dr. Del Favia- severe atypia but not melanoma q4 checks. advised regular sunscreen use. Denies worrisome, changing, or new skin lesions.  8. Birth control/STD check- hysterectomy and only active with husband  9. Osteoporosis screening at 16- will plan on this- does take vitamin D - maybe 54  10. Smoking associated screening - never smoker   Status of chronic or acute concerns   #reports brother told high iron- hemochromatosis- we will check iron levels  # Neuroma-had seen Dr. Perri Brake with [facility] and we had discussed possibly seeing Dr. Rosebud Confer or Dr. Felipe Horton or Dr. Alease Rodert Hinch- saw Dr. Rosebud Confer and had surgery and bunionectomy- has follow up next week- some issues with loss of sensation- still hasn't been  able to run yet due to blister formatoin  # Migraines-patient uses rizatriptan  10 mg sparingly about once a month but can last several daysj   # Allergies-on loratadine still    # Positive for the familial low penetrance CHEK2 variant. - should receive colonoscopies every 5 years -should consider yearly breast MRIs in addition to mammograms due to lifetime breast cancer risk >20% based on family history-based risk models.   #hyperlipidemia S: Medication:omega 3   Lab Results  Component Value Date   CHOL 250 (H) 07/18/2022   HDL 79.90 07/18/2022   LDLCALC 150 (H) 07/18/2022   TRIG 103.0 07/18/2022   CHOLHDL 3 07/18/2022   A/P: check lipids today- also she wants to get baseline CT calcium and lpa  Recommended follow up: Return in about 1 year (around 04/06/2025) for physical or sooner if needed.Schedule b4 you leave. Future Appointments  Date Time Provider Department Center  07/21/2024  9:00 AM [facility] MR 1 [id number]   Lab/Order associations: fasting   ICD-10-CM   1. Preventative health care  Z00.00     2. Hyperlipidemia, unspecified hyperlipidemia type  E78.5 Comprehensive metabolic panel with GFR    Lipid panel    CBC with Differential/Platelet    TSH    CT CARDIAC SCORING ([facility] LOCATIONS ONLY)    Lipoprotein A (LPA)    3. Family history of hemochromatosis  Z83.49 IBC + Ferritin    4. Periodic headache syndrome, not intractable  G43.C0 rizatriptan  (MAXALT ) 10 MG tablet      Meds ordered this encounter  Medications   rizatriptan  (MAXALT ) 10 MG tablet    Sig: Once daily May repeat in 2 hours if needed (max 2 doses per day)    Dispense:  9 tablet    Refill:  11    Return precautions advised.  Clarisa Crooked, MD

## 2024-04-06 NOTE — Patient Instructions (Addendum)
 Please stop by lab before you go If you have mychart- we will send your results within 3 business days of us  receiving them.  If you do not have mychart- we will call you about results within 5 business days of us  receiving them.  *please also note that you will see labs on mychart as soon as they post. I will later go in and write notes on them- will say "notes from Dr. Arlene Ben"   We could consider Nurtec in the future if unhappy with the maxalt   Recommended follow up: Return in about 1 year (around 04/06/2025) for physical or sooner if needed.Schedule b4 you leave.

## 2024-04-10 LAB — LIPOPROTEIN A (LPA): Lipoprotein (a): 35 nmol/L (ref <75)

## 2024-04-11 ENCOUNTER — Encounter: Payer: Self-pay | Admitting: Family Medicine

## 2024-04-15 ENCOUNTER — Ambulatory Visit
Admission: RE | Admit: 2024-04-15 | Discharge: 2024-04-15 | Disposition: A | Discharge status: Home / Self Care | Source: Ambulatory Visit | Attending: Family Medicine | Admitting: Family Medicine

## 2024-04-15 DIAGNOSIS — E785 Hyperlipidemia, unspecified: Secondary | ICD-10-CM

## 2024-04-20 ENCOUNTER — Encounter: Payer: Self-pay | Admitting: Family Medicine

## 2024-07-21 ENCOUNTER — Ambulatory Visit
Admission: RE | Admit: 2024-07-21 | Discharge: 2024-07-21 | Disposition: A | Discharge status: Home / Self Care | Payer: 59 | Source: Ambulatory Visit | Attending: Obstetrics and Gynecology | Admitting: Obstetrics and Gynecology

## 2024-07-21 DIAGNOSIS — R898 Other abnormal findings in specimens from other organs, systems and tissues: Secondary | ICD-10-CM

## 2024-07-21 MED ORDER — GADOPICLENOL 0.5 MMOL/ML IV SOLN
6.0000 mL | Freq: Once | INTRAVENOUS | Status: AC | PRN
  Administered 2024-07-21: 6 mL via INTRAVENOUS

## 2024-11-25 ENCOUNTER — Other Ambulatory Visit: Payer: Self-pay | Admitting: Obstetrics and Gynecology

## 2024-11-25 DIAGNOSIS — Z1231 Encounter for screening mammogram for malignant neoplasm of breast: Secondary | ICD-10-CM

## 2025-02-22 ENCOUNTER — Ambulatory Visit

## 2025-04-08 ENCOUNTER — Encounter: Admitting: Family Medicine
